# Patient Record
Sex: Male | Born: 1950 | Race: Asian | Hispanic: No | Marital: Married | Smoking: Never smoker
Health system: Southern US, Community
[De-identification: ages and names within clinical notes are randomized; demographics above are authoritative.]

## PROBLEM LIST (undated history)

## (undated) DIAGNOSIS — Z941 Heart transplant status: Secondary | ICD-10-CM

## (undated) DIAGNOSIS — I10 Essential (primary) hypertension: Secondary | ICD-10-CM

## (undated) DIAGNOSIS — M1990 Unspecified osteoarthritis, unspecified site: Secondary | ICD-10-CM

## (undated) DIAGNOSIS — N289 Disorder of kidney and ureter, unspecified: Secondary | ICD-10-CM

## (undated) HISTORY — PX: EYE SURGERY: SHX253

## (undated) HISTORY — PX: AV FISTULA PLACEMENT: SHX1204

## (undated) HISTORY — PX: HEART TRANSPLANT: SHX268

---

## 2007-02-25 DIAGNOSIS — Z941 Heart transplant status: Secondary | ICD-10-CM

## 2007-02-25 HISTORY — DX: Heart transplant status: Z94.1

## 2008-02-03 ENCOUNTER — Inpatient Hospital Stay: Payer: Self-pay | Admitting: Cardiovascular Disease

## 2008-07-27 ENCOUNTER — Ambulatory Visit: Payer: Self-pay | Admitting: Internal Medicine

## 2008-08-07 ENCOUNTER — Ambulatory Visit: Payer: Self-pay | Admitting: Internal Medicine

## 2008-11-01 ENCOUNTER — Encounter: Payer: Self-pay | Admitting: Internal Medicine

## 2008-11-24 ENCOUNTER — Encounter: Payer: Self-pay | Admitting: Internal Medicine

## 2016-02-08 ENCOUNTER — Emergency Department: Payer: Medicare Other

## 2016-02-08 ENCOUNTER — Encounter: Payer: Self-pay | Admitting: Emergency Medicine

## 2016-02-08 ENCOUNTER — Inpatient Hospital Stay
Admission: EM | Admit: 2016-02-08 | Discharge: 2016-02-14 | DRG: 682 | Disposition: A | Discharge status: Home Health | Payer: Medicare Other | Attending: Specialist | Admitting: Specialist

## 2016-02-08 ENCOUNTER — Encounter
Admission: EM | Disposition: A | Discharge status: Home Health | Payer: Self-pay | Source: Home / Self Care | Attending: Specialist

## 2016-02-08 DIAGNOSIS — I2729 Other secondary pulmonary hypertension: Secondary | ICD-10-CM | POA: Diagnosis present

## 2016-02-08 DIAGNOSIS — Z941 Heart transplant status: Secondary | ICD-10-CM

## 2016-02-08 DIAGNOSIS — D631 Anemia in chronic kidney disease: Secondary | ICD-10-CM | POA: Diagnosis present

## 2016-02-08 DIAGNOSIS — N186 End stage renal disease: Secondary | ICD-10-CM | POA: Diagnosis present

## 2016-02-08 DIAGNOSIS — N189 Chronic kidney disease, unspecified: Secondary | ICD-10-CM | POA: Diagnosis not present

## 2016-02-08 DIAGNOSIS — I5033 Acute on chronic diastolic (congestive) heart failure: Secondary | ICD-10-CM | POA: Diagnosis present

## 2016-02-08 DIAGNOSIS — R0602 Shortness of breath: Secondary | ICD-10-CM

## 2016-02-08 DIAGNOSIS — I132 Hypertensive heart and chronic kidney disease with heart failure and with stage 5 chronic kidney disease, or end stage renal disease: Secondary | ICD-10-CM | POA: Diagnosis present

## 2016-02-08 DIAGNOSIS — E871 Hypo-osmolality and hyponatremia: Secondary | ICD-10-CM | POA: Diagnosis present

## 2016-02-08 DIAGNOSIS — R0902 Hypoxemia: Secondary | ICD-10-CM

## 2016-02-08 DIAGNOSIS — J9601 Acute respiratory failure with hypoxia: Secondary | ICD-10-CM | POA: Diagnosis present

## 2016-02-08 DIAGNOSIS — Z79899 Other long term (current) drug therapy: Secondary | ICD-10-CM

## 2016-02-08 DIAGNOSIS — Z992 Dependence on renal dialysis: Secondary | ICD-10-CM | POA: Diagnosis not present

## 2016-02-08 DIAGNOSIS — I509 Heart failure, unspecified: Secondary | ICD-10-CM | POA: Diagnosis not present

## 2016-02-08 DIAGNOSIS — J811 Chronic pulmonary edema: Secondary | ICD-10-CM

## 2016-02-08 DIAGNOSIS — I429 Cardiomyopathy, unspecified: Secondary | ICD-10-CM | POA: Diagnosis present

## 2016-02-08 DIAGNOSIS — N179 Acute kidney failure, unspecified: Secondary | ICD-10-CM | POA: Diagnosis present

## 2016-02-08 DIAGNOSIS — I10 Essential (primary) hypertension: Secondary | ICD-10-CM

## 2016-02-08 DIAGNOSIS — E785 Hyperlipidemia, unspecified: Secondary | ICD-10-CM | POA: Diagnosis present

## 2016-02-08 DIAGNOSIS — E1122 Type 2 diabetes mellitus with diabetic chronic kidney disease: Secondary | ICD-10-CM | POA: Diagnosis present

## 2016-02-08 DIAGNOSIS — N170 Acute kidney failure with tubular necrosis: Secondary | ICD-10-CM | POA: Diagnosis not present

## 2016-02-08 DIAGNOSIS — E8770 Fluid overload, unspecified: Secondary | ICD-10-CM | POA: Diagnosis present

## 2016-02-08 DIAGNOSIS — J810 Acute pulmonary edema: Secondary | ICD-10-CM | POA: Diagnosis not present

## 2016-02-08 DIAGNOSIS — R609 Edema, unspecified: Secondary | ICD-10-CM

## 2016-02-08 HISTORY — DX: Disorder of kidney and ureter, unspecified: N28.9

## 2016-02-08 HISTORY — PX: PERIPHERAL VASCULAR CATHETERIZATION: SHX172C

## 2016-02-08 HISTORY — DX: Heart transplant status: Z94.1

## 2016-02-08 LAB — BASIC METABOLIC PANEL
ANION GAP: 15 (ref 5–15)
BUN: 88 mg/dL — ABNORMAL HIGH (ref 6–20)
CALCIUM: 7.4 mg/dL — AB (ref 8.9–10.3)
CO2: 18 mmol/L — ABNORMAL LOW (ref 22–32)
Chloride: 86 mmol/L — ABNORMAL LOW (ref 101–111)
Creatinine, Ser: 9.12 mg/dL — ABNORMAL HIGH (ref 0.61–1.24)
GFR, EST AFRICAN AMERICAN: 6 mL/min — AB (ref >60)
GFR, EST NON AFRICAN AMERICAN: 5 mL/min — AB (ref >60)
Glucose, Bld: 124 mg/dL — ABNORMAL HIGH (ref 65–99)
Potassium: 4 mmol/L (ref 3.5–5.1)
Sodium: 119 mmol/L — CL (ref 135–145)

## 2016-02-08 LAB — CBC WITH DIFFERENTIAL/PLATELET
BASOS ABS: 0.1 10*3/uL (ref 0–0.1)
BASOS PCT: 1 %
EOS PCT: 1 %
Eosinophils Absolute: 0.1 10*3/uL (ref 0–0.7)
HEMATOCRIT: 26.7 % — AB (ref 40.0–52.0)
Hemoglobin: 8.9 g/dL — ABNORMAL LOW (ref 13.0–18.0)
LYMPHS PCT: 5 %
Lymphs Abs: 0.6 10*3/uL — ABNORMAL LOW (ref 1.0–3.6)
MCH: 27.9 pg (ref 26.0–34.0)
MCHC: 33.4 g/dL (ref 32.0–36.0)
MCV: 83.7 fL (ref 80.0–100.0)
MONO ABS: 1.2 10*3/uL — AB (ref 0.2–1.0)
Monocytes Relative: 9 %
NEUTROS ABS: 11.3 10*3/uL — AB (ref 1.4–6.5)
Neutrophils Relative %: 84 %
PLATELETS: 303 10*3/uL (ref 150–440)
RBC: 3.19 MIL/uL — AB (ref 4.40–5.90)
RDW: 13.7 % (ref 11.5–14.5)
WBC: 13.4 10*3/uL — AB (ref 3.8–10.6)

## 2016-02-08 LAB — IRON AND TIBC
IRON: 14 ug/dL — AB (ref 45–182)
SATURATION RATIOS: 7 % — AB (ref 17.9–39.5)
TIBC: 190 ug/dL — AB (ref 250–450)
UIBC: 176 ug/dL

## 2016-02-08 LAB — PHOSPHORUS: Phosphorus: 6.8 mg/dL — ABNORMAL HIGH (ref 2.5–4.6)

## 2016-02-08 LAB — FERRITIN: Ferritin: 686 ng/mL — ABNORMAL HIGH (ref 24–336)

## 2016-02-08 LAB — BRAIN NATRIURETIC PEPTIDE: B Natriuretic Peptide: 1139 pg/mL — ABNORMAL HIGH (ref 0.0–100.0)

## 2016-02-08 LAB — TROPONIN I

## 2016-02-08 SURGERY — DIALYSIS/PERMA CATHETER INSERTION
Anesthesia: Moderate Sedation

## 2016-02-08 MED ORDER — LIDOCAINE-EPINEPHRINE 1 %-1:100000 IJ SOLN
INTRAMUSCULAR | Status: AC
  Filled 2016-02-08: qty 1

## 2016-02-08 MED ORDER — TACROLIMUS 0.5 MG PO CAPS
0.5000 mg | ORAL_CAPSULE | Freq: Two times a day (BID) | ORAL | Status: DC
  Administered 2016-02-08 – 2016-02-14 (×12): 0.5 mg via ORAL
  Filled 2016-02-08 (×14): qty 1

## 2016-02-08 MED ORDER — ACETAMINOPHEN 325 MG PO TABS
650.0000 mg | ORAL_TABLET | Freq: Four times a day (QID) | ORAL | Status: DC | PRN
  Administered 2016-02-12: 650 mg via ORAL
  Filled 2016-02-08: qty 2

## 2016-02-08 MED ORDER — FENTANYL CITRATE (PF) 100 MCG/2ML IJ SOLN
INTRAMUSCULAR | Status: AC
  Filled 2016-02-08: qty 2

## 2016-02-08 MED ORDER — ANTICOAGULANT SODIUM CITRATE 4% (200MG/5ML) IV SOLN
5.0000 mL | Freq: Every day | Status: DC
  Administered 2016-02-10 – 2016-02-14 (×4): 5 mL via INTRAVENOUS
  Filled 2016-02-08 (×12): qty 250

## 2016-02-08 MED ORDER — FERROUS SULFATE 325 (65 FE) MG PO TABS
325.0000 mg | ORAL_TABLET | Freq: Two times a day (BID) | ORAL | Status: DC
  Administered 2016-02-08 – 2016-02-14 (×12): 325 mg via ORAL
  Filled 2016-02-08 (×12): qty 1

## 2016-02-08 MED ORDER — HYDROCODONE-ACETAMINOPHEN 5-325 MG PO TABS
1.0000 | ORAL_TABLET | Freq: Four times a day (QID) | ORAL | Status: DC | PRN
  Administered 2016-02-08 – 2016-02-10 (×4): 1 via ORAL
  Filled 2016-02-08 (×4): qty 1

## 2016-02-08 MED ORDER — PANTOPRAZOLE SODIUM 40 MG PO TBEC
40.0000 mg | DELAYED_RELEASE_TABLET | Freq: Every day | ORAL | Status: DC
  Administered 2016-02-09 – 2016-02-14 (×6): 40 mg via ORAL
  Filled 2016-02-08 (×6): qty 1

## 2016-02-08 MED ORDER — CEFAZOLIN IN D5W 1 GM/50ML IV SOLN
1.0000 g | Freq: Once | INTRAVENOUS | Status: AC
  Administered 2016-02-08: 1 g via INTRAVENOUS
  Filled 2016-02-08 (×2): qty 50

## 2016-02-08 MED ORDER — HEPARIN SODIUM (PORCINE) 10000 UNIT/ML IJ SOLN
INTRAMUSCULAR | Status: AC
  Filled 2016-02-08: qty 1

## 2016-02-08 MED ORDER — ALTEPLASE 2 MG IJ SOLR
INTRAMUSCULAR | Status: AC
  Filled 2016-02-08: qty 2

## 2016-02-08 MED ORDER — CEFAZOLIN (ANCEF) 1 G IV SOLR: 1.0000 g | INTRAVENOUS | Status: DC

## 2016-02-08 MED ORDER — FENTANYL CITRATE (PF) 100 MCG/2ML IJ SOLN
INTRAMUSCULAR | Status: DC | PRN
  Administered 2016-02-08: 50 ug via INTRAVENOUS
  Administered 2016-02-08: 50 ug

## 2016-02-08 MED ORDER — MYCOPHENOLATE MOFETIL 250 MG PO CAPS
500.0000 mg | ORAL_CAPSULE | Freq: Two times a day (BID) | ORAL | Status: DC
  Administered 2016-02-08 – 2016-02-14 (×12): 500 mg via ORAL
  Filled 2016-02-08 (×13): qty 2

## 2016-02-08 MED ORDER — MIDAZOLAM HCL 2 MG/2ML IJ SOLN
INTRAMUSCULAR | Status: AC
  Filled 2016-02-08: qty 2

## 2016-02-08 MED ORDER — HEPARIN (PORCINE) IN NACL 2-0.9 UNIT/ML-% IJ SOLN
INTRAMUSCULAR | Status: AC
  Filled 2016-02-08: qty 500

## 2016-02-08 MED ORDER — PRAVASTATIN SODIUM 20 MG PO TABS
40.0000 mg | ORAL_TABLET | Freq: Every day | ORAL | Status: DC
  Administered 2016-02-09 – 2016-02-13 (×5): 40 mg via ORAL
  Filled 2016-02-08 (×5): qty 2

## 2016-02-08 MED ORDER — CARVEDILOL 25 MG PO TABS
25.0000 mg | ORAL_TABLET | Freq: Two times a day (BID) | ORAL | Status: DC
  Administered 2016-02-08 – 2016-02-14 (×12): 25 mg via ORAL
  Filled 2016-02-08 (×12): qty 1

## 2016-02-08 MED ORDER — AMLODIPINE BESYLATE 10 MG PO TABS
10.0000 mg | ORAL_TABLET | Freq: Every day | ORAL | Status: DC
  Administered 2016-02-09: 10 mg via ORAL
  Filled 2016-02-08: qty 1

## 2016-02-08 MED ORDER — SODIUM CHLORIDE 0.9% FLUSH
3.0000 mL | Freq: Two times a day (BID) | INTRAVENOUS | Status: DC
  Administered 2016-02-08 – 2016-02-13 (×9): 3 mL via INTRAVENOUS

## 2016-02-08 MED ORDER — MIDAZOLAM HCL 2 MG/2ML IJ SOLN
INTRAMUSCULAR | Status: DC | PRN
  Administered 2016-02-08 (×2): 1 mg via INTRAVENOUS

## 2016-02-08 MED ORDER — TUBERCULIN PPD 5 UNIT/0.1ML ID SOLN
5.0000 [IU] | Freq: Once | INTRADERMAL | Status: AC
  Administered 2016-02-08: 5 [IU] via INTRADERMAL
  Filled 2016-02-08 (×2): qty 0.1

## 2016-02-08 SURGICAL SUPPLY — 5 items
CATH PALINDROME RT-P 15FX19CM (CATHETERS) ×2 IMPLANT
NEEDLE ENTRY 21GA 7CM ECHOTIP (NEEDLE) ×2 IMPLANT
PACK ANGIOGRAPHY (CUSTOM PROCEDURE TRAY) ×2 IMPLANT
SET INTRO CAPELLA COAXIAL (SET/KITS/TRAYS/PACK) ×2 IMPLANT
TOWEL OR 17X26 4PK STRL BLUE (TOWEL DISPOSABLE) ×2 IMPLANT

## 2016-02-08 NOTE — Progress Notes (Signed)
Pre dialysis assessment 

## 2016-02-08 NOTE — Consult Note (Signed)
[facility] Consult Note  MRN : [id number]  Ralph Allison is a 65 y.o. (1950/05/10) male who presents with chief complaint of  Chief Complaint  Patient presents with  . Shortness of Breath  . Leg Swelling  .  History of Present Illness: I am asked to evaluate Ralph Allison by Dr Candiss Norse.  The patient is a 66 y.o. male with a known history of Diabetes, heart transplant 2008, chronic renal failure- follows at [facility] cardiology for his transplant related issues. Recently started having worsening in his kidney function and pulmonary edema and was admitted last week at [facility] where he was discharged home and given appointment with nephrology clinic and vascular clinic for possible AV fistula placements. He continued to feel short of breath and gaining weight so he went to his primary care physician yesterday where he suggested that patient would need to have 1 or 2 sessions of dialysis to take out this fluid and he cannot wait until those appointments in have for next month. Concerned with this they decided to come to emergency room today.  Current Facility-Administered Medications  Medication Dose Route Frequency Provider Last Rate Last Dose  . amLODipine (NORVASC) tablet 10 mg  10 mg Oral Daily Vaughan Basta, MD      . carvedilol (COREG) tablet 25 mg  25 mg Oral Q12H Vaughan Basta, MD      . ferrous sulfate tablet 325 mg  325 mg Oral BID Vaughan Basta, MD      . mycophenolate (CELLCEPT) capsule 500 mg  500 mg Oral BID Vaughan Basta, MD      . pantoprazole (PROTONIX) EC tablet 40 mg  40 mg Oral Daily Vaughan Basta, MD      . Derrill Memo ON 02/09/2016] pravastatin (PRAVACHOL) tablet 40 mg  40 mg Oral QHS Vaughan Basta, MD      . sodium chloride flush (NS) 0.9 % injection 3 mL  3 mL Intravenous Q12H Vaughan Basta, MD      . tacrolimus (PROGRAF) capsule 0.5 mg  0.5 mg Oral BID Vaughan Basta, MD        Past  Medical History:  Diagnosis Date  . Diabetes mellitus without complication ([place])   . Heart transplant recipient [facility]) 2009  . Renal disorder     Past Surgical History:  Procedure Laterality Date  . HEART TRANSPLANT      Social History Social History  Substance Use Topics  . Smoking status: Never Smoker  . Smokeless tobacco: Never Used  . Alcohol use No    Family History Family History  Problem Relation Age of Onset  . Hypertension Father   No family history of bleeding/clotting disorders, porphyria or autoimmune disease   Allergies  Allergen Reactions  . Heparin     Heart transplant     REVIEW OF SYSTEMS (Negative unless checked)  Constitutional: $RemoveBeforeDE'[]'JBjYCJAJqfTcISv$ Weight loss  $Rem'[]'WBta$ Fever  $Remo'[]'rMeLX$ Chills Cardiac: $RemoveBeforeD'[]'cdPQDaicqKVteK$ Chest pain   '[]'$ Chest pressure   '[]'$ Palpitations   '[x]'$ Shortness of breath when laying flat   '[]'$ Shortness of breath at rest   '[x]'$ Shortness of breath with exertion. Vascular:  $RemoveBe'[]'LqclGsjZX$ Pain in legs with walking   '[]'$ Pain in legs at rest   '[]'$ Pain in legs when laying flat   '[]'$ Claudication   '[]'$ Pain in feet when walking  $Remove'[]'HmujfVL$ Pain in feet at rest  $Rem'[]'bnMp$ Pain in feet when laying flat   '[]'$ History of DVT   '[]'$ Phlebitis   '[x]'$ Swelling in legs   '[]'$ Varicose veins   '[]'$ Non-healing ulcers Pulmonary:   '[]'$   Uses home oxygen   [] Productive cough   [] Hemoptysis   [] Wheeze  [] COPD   [] Asthma Neurologic:  [] Dizziness  [] Blackouts   [] Seizures   [] History of stroke   [] History of TIA  [] Aphasia   [] Temporary blindness   [] Dysphagia   [] Weakness or numbness in arms   [] Weakness or numbness in legs Musculoskeletal:  [] Arthritis   [] Joint swelling   [] Joint pain   [] Low back pain Hematologic:  [] Easy bruising  [] Easy bleeding   [] Hypercoagulable state   [] Anemic  [] Hepatitis Gastrointestinal:  [] Blood in stool   [] Vomiting blood  [] Gastroesophageal reflux/heartburn   [] Difficulty swallowing. Genitourinary:  [x] Chronic kidney disease   [] Difficult urination  [] Frequent urination  [] Burning with urination   [] Blood in urine Skin:   [] Rashes   [] Ulcers   [] Wounds Psychological:  [] History of anxiety   []  History of major depression.  Physical Examination  Vitals:   02/08/16 1430 02/08/16 1500 02/08/16 1530 02/08/16 1603  BP: 126/78 130/78 122/79 118/66  Pulse: 75 75 78 79  Resp: 18 (!) 21 (!) 22 19  Temp:    97.4 F (36.3 C)  TempSrc:    Oral  SpO2: 99% 95% 94% 97%  Weight:    54.9 kg (121 lb 1.6 oz)  Height:    5\' 2"  (1.575 m)   Body mass index is 22.15 kg/m. Gen:  WD/WN, NAD Head: [place]/AT, No temporalis wasting. Prominent temp pulse not noted. Ear/Nose/Throat: Hearing grossly intact, nares w/o erythema or drainage, oropharynx w/o Erythema/Exudate Eyes: Sclera non-icteric, conjunctiva clear Neck: Trachea midline.  No JVD.  Pulmonary:  Good air movement, respirations not labored, equal bilaterally.  Cardiac: RRR, normal S1, S2. Vascular:  Vessel Right Left  Radial Palpable Palpable  Ulnar Palpable Palpable  Brachial Palpable Palpable  Carotid Palpable, without bruit Palpable, without bruit  Aorta Not palpable N/A  Femoral Palpable Palpable  Gastrointestinal: soft, non-tender/non-distended. No guarding/reflex.  Musculoskeletal: M/S 5/5 throughout.  Extremities without ischemic changes.  No deformity or atrophy. No edema. Neurologic: Sensation grossly intact in extremities.  Symmetrical.  Speech is fluent. Motor exam as listed above. Psychiatric: Judgment intact, Mood & affect appropriate for pt's clinical situation. Dermatologic: No rashes or ulcers noted.  No cellulitis or open wounds. Lymph : No Cervical, Axillary, or Inguinal lymphadenopathy.    CBC Lab Results  Component Value Date   WBC 13.4 (H) 02/08/2016   HGB 8.9 (L) 02/08/2016   HCT 26.7 (L) 02/08/2016   MCV 83.7 02/08/2016   PLT 303 02/08/2016    BMET    Component Value Date/Time   NA 119 (LL) 02/08/2016 1337   K 4.0 02/08/2016 1337   CL 86 (L) 02/08/2016 1337   CO2 18 (L) 02/08/2016 1337   GLUCOSE 124 (H) 02/08/2016 1337   BUN  88 (H) 02/08/2016 1337   CREATININE 9.12 (H) 02/08/2016 1337   CALCIUM 7.4 (L) 02/08/2016 1337   GFRNONAA 5 (L) 02/08/2016 1337   GFRAA 6 (L) 02/08/2016 1337   Estimated Creatinine Clearance: 6.2 mL/min (by C-G formula based on SCr of 9.12 mg/dL (H)).  COAG No results found for: INR, PROTIME  Radiology Dg Chest Port 1 View  Result Date: 02/08/2016 CLINICAL DATA:  Acute renal failure.  Missed dialysis. EXAM: PORTABLE CHEST 1 VIEW COMPARISON:  02/03/2008 FINDINGS: Surgical changes from bypass surgery are noted. The heart is enlarged. There is tortuosity and ectasia of the thoracic aorta. There are bilateral pleural effusions, right greater than left. Perihilar/fat plane airspace process, asymmetric on  the right. This is likely asymmetric pulmonary edema. No significant bony findings. IMPRESSION: Cardiac enlargement, bilateral pleural effusions and probable asymmetric pattern of perihilar pulmonary edema. Electronically Signed   By: Marijo Sanes M.D.   On: 02/08/2016 14:05      Assessment/Plan 1.  Acute on chronic renal failure   Pulmonary edema   Acute hypoxic respiratory failure    Patient will need to be initiated on hemodialysis.   We will urgently place a permacath    Continue supplemental oxygen.  2.  Hypertension   Continue home medication, currently stable.   Hold Lasix.  3.  History of heart transplant   Continue his transplant medication as he was taking at home.   He follows at [facility] for this.  4.  Hyperlipidemia    continue statin.  5.  Chronic anemia   Due to chronic renal failure. Continue monitoring.  Hortencia Pilar, MD  02/08/2016 5:26 PM    This note was created with Dragon medical transcription system.  Any error is purely unintentional

## 2016-02-08 NOTE — ED Notes (Addendum)
Per wife, she thinks he needs to get dialysis; currently is not a dialysis pt. SHOB worse with exertion. Started a day or 2 ago. sats were 86% room air.

## 2016-02-08 NOTE — Progress Notes (Signed)
HD COMPLETED  

## 2016-02-08 NOTE — ED Notes (Signed)
EKG was completed and exported into the system.

## 2016-02-08 NOTE — Progress Notes (Signed)
Patient given hot dinner tray. Family at bedside. Patient to go to dialysis from the unit. Family is aware. Report called to RN Glennon Mac on 2A.

## 2016-02-08 NOTE — Progress Notes (Signed)
Patient returned from dialysis with no issues. C/o pain on right chest due to parmacath placement. Hydrocodone  1 tab PRN administered and will re-assess later. PPD of 5 units administered on right forearm with a lot # [id number] and an expiration date of 01/08/17. Patient tolerated well. Will be read in 2-3 days. Will continue to monitor.

## 2016-02-08 NOTE — ED Notes (Signed)
Dr Jimmye Norman notified [phone]

## 2016-02-08 NOTE — Consult Note (Signed)
Date: 02/08/2016                  Patient Name:  Ralph Allison  MRN: [id number]  DOB: 1950-08-07  Age / Sex: 65 y.o., male         PCP: Diamond Nickel, MD                 Service Requesting Consult: Internal medicine                  Reason for Consult: Chronic kidney disease stage V             History of Present Illness: Patient is a 65 y.o. male with medical problems of Chronic kidney disease stage V followed at [facility], heart transplant at [facility] in February 2010, hypertension, HIT  who was admitted to [facility] on 02/08/2016 for evaluation of shortness of breath and lower extremity edema.  Patient is in the process of getting permanent access at [facility]. He is scheduled for surgery for his access next month. Over the last several days, patient progressively got short of breath. He went to see his primary care doctor who advised him to get admitted to the hospital to start dialysis. Patient does not have any access at present. Patient has poor appetite. His legs are swollen. Cause for renal failure is secondary to hypertension and chronic CNI exposure He takes Prograf and mycophenolate for immunosuppression for heart transplant     Medications: Outpatient medications: Prescriptions Prior to Admission  Medication Sig Dispense Refill Last Dose  . amLODipine (NORVASC) 10 MG tablet Take 1 tablet by mouth daily.   02/08/2016 at 0800  . aspirin EC 81 MG tablet Take 1 tablet by mouth every 3 (three) days.   Past Week at Unknown time  . b complex vitamins tablet Take 1 tablet by mouth daily.   02/08/2016 at 0800  . carvedilol (COREG) 25 MG tablet Take 1 tablet by mouth every 12 (twelve) hours.   02/08/2016 at 0800  . ferrous sulfate 325 (65 FE) MG EC tablet Take 1 tablet by mouth 2 (two) times daily.     . furosemide (LASIX) 40 MG tablet Take 1 tablet by mouth 2 (two) times daily.   02/08/2016 at 0800  . mycophenolate (CELLCEPT) 500 MG tablet Take 1 tablet by mouth 2 (two) times daily.    02/08/2016 at 0800  . pantoprazole (PROTONIX) 40 MG tablet Take 1 tablet by mouth daily.   02/08/2016 at 0800  . pravastatin (PRAVACHOL) 40 MG tablet Take 1 tablet by mouth at bedtime.   02/08/2016 at 0800  . sodium bicarbonate 650 MG tablet Take 650 mg by mouth 2 (two) times daily.   02/08/2016 at 0800  . tacrolimus (PROGRAF) 0.5 MG capsule Take 0.5 mg by mouth 2 (two) times daily.   02/08/2016 at 0800    Current medications: Current Facility-Administered Medications  Medication Dose Route Frequency Provider Last Rate Last Dose  . [MAR Hold] amLODipine (NORVASC) tablet 10 mg  10 mg Oral Daily Vaughan Basta, MD      . Doug Sou Hold] carvedilol (COREG) tablet 25 mg  25 mg Oral Q12H Vaughan Basta, MD      . Doug Sou Hold] ceFAZolin (ANCEF) powder 1 g  1 g Other To OR Katha Cabal, MD      . Doug Sou Hold] ferrous sulfate tablet 325 mg  325 mg Oral BID Vaughan Basta, MD      . Doug Sou Hold] mycophenolate (  CELLCEPT) capsule 500 mg  500 mg Oral BID Vaughan Basta, MD      . Doug Sou Hold] pantoprazole (PROTONIX) EC tablet 40 mg  40 mg Oral Daily Vaughan Basta, MD      . Doug Sou Hold] pravastatin (PRAVACHOL) tablet 40 mg  40 mg Oral QHS Vaughan Basta, MD      . Doug Sou Hold] sodium chloride flush (NS) 0.9 % injection 3 mL  3 mL Intravenous Q12H Vaughan Basta, MD      . Doug Sou Hold] tacrolimus (PROGRAF) capsule 0.5 mg  0.5 mg Oral BID Vaughan Basta, MD          Allergies: Allergies  Allergen Reactions  . Heparin     Heart transplant      Past Medical History: Past Medical History:  Diagnosis Date  . Diabetes mellitus without complication ([place])   . Heart transplant recipient [facility]) 2009  . Renal disorder      Past Surgical History: Past Surgical History:  Procedure Laterality Date  . HEART TRANSPLANT       Family History: Family History  Problem Relation Age of Onset  . Hypertension Father      Social History: Social History    Social History  . Marital status: Married    Spouse name: N/A  . Number of children: N/A  . Years of education: N/A   Occupational History  . Not on file.   Social History Main Topics  . Smoking status: Never Smoker  . Smokeless tobacco: Never Used  . Alcohol use No  . Drug use: Unknown  . Sexual activity: Not on file   Other Topics Concern  . Not on file   Social History Narrative  . No narrative on file     Review of Systems: Gen: No fevers or chills  HEENT: Denies any vision or hearing problems  CV: History of heart transplant, takes immunosuppressants chronically  Resp: Shortness of breath for the past several days  LZ:JQBHALPFX appetite, unintentional weight loss  GU : Denies any problems with voiding, no blood in the urine  MS: No acute joint pains  Derm:  No complaints  Psych:No complaints  Heme: No complaints  Neuro: No complaints  Endocrine. No complaints  Vital Signs: Blood pressure 122/75, pulse 84, temperature 97.8 F (36.6 C), temperature source Oral, resp. rate 18, height $RemoveBe'5\' 2"'SKromnRzs$  (1.575 m), weight 54.9 kg (121 lb), SpO2 92 %.   Intake/Output Summary (Last 24 hours) at 02/08/16 1757 Last data filed at 02/08/16 1728  Gross per 24 hour  Intake                0 ml  Output                0 ml  Net                0 ml    Weight trends: Filed Weights   02/08/16 1333 02/08/16 1603 02/08/16 1750  Weight: 56.2 kg (124 lb) 54.9 kg (121 lb 1.6 oz) 54.9 kg (121 lb)    Physical Exam: General:  No acute distress  HEENT Anicteric, moist mucus membranes  Neck:  supple  Lungs: Coarse crackles b/l  Heart::  no rub or gallop  Abdomen: Soft, NT  Extremities:  ++ edema  Neurologic: Alert, oreinted  Skin: No acute rashes  Access: To be placed          Lab results: Basic Metabolic Panel:  Recent Labs Lab 02/08/16 1337  NA  119*  K 4.0  CL 86*  CO2 18*  GLUCOSE 124*  BUN 88*  CREATININE 9.12*  CALCIUM 7.4*    Liver Function Tests: No  results for input(s): AST, ALT, ALKPHOS, BILITOT, PROT, ALBUMIN in the last 168 hours. No results for input(s): LIPASE, AMYLASE in the last 168 hours. No results for input(s): AMMONIA in the last 168 hours.  CBC:  Recent Labs Lab 02/08/16 1337  WBC 13.4*  NEUTROABS 11.3*  HGB 8.9*  HCT 26.7*  MCV 83.7  PLT 303    Cardiac Enzymes:  Recent Labs Lab 02/08/16 1337  TROPONINI <0.03    BNP: Invalid input(s): POCBNP  CBG: No results for input(s): GLUCAP in the last 168 hours.  Microbiology: No results found for this or any previous visit (from the past 720 hour(s)).   Coagulation Studies: No results for input(s): LABPROT, INR in the last 72 hours.  Urinalysis: No results for input(s): COLORURINE, LABSPEC, PHURINE, GLUCOSEU, HGBUR, BILIRUBINUR, KETONESUR, PROTEINUR, UROBILINOGEN, NITRITE, LEUKOCYTESUR in the last 72 hours.  Invalid input(s): APPERANCEUR      Imaging: Dg Chest Port 1 View  Result Date: 02/08/2016 CLINICAL DATA:  Acute renal failure.  Missed dialysis. EXAM: PORTABLE CHEST 1 VIEW COMPARISON:  02/03/2008 FINDINGS: Surgical changes from bypass surgery are noted. The heart is enlarged. There is tortuosity and ectasia of the thoracic aorta. There are bilateral pleural effusions, right greater than left. Perihilar/fat plane airspace process, asymmetric on the right. This is likely asymmetric pulmonary edema. No significant bony findings. IMPRESSION: Cardiac enlargement, bilateral pleural effusions and probable asymmetric pattern of perihilar pulmonary edema. Electronically Signed   By: Marijo Sanes M.D.   On: 02/08/2016 14:05      Assessment & Plan: Pt is a 65 y.o. yo male with  medical problems of Chronic kidney disease stage V followed at [facility], heart transplant at [facility] in February 2010, hypertension, HIT  who was admitted to [facility] on 02/08/2016 for evaluation of shortness of breath and lower extremity edema.  1. End-stage renal disease 2. Severe  hyponatremia 3. Volume overload 4. Pulmonary edema 5. Anemia  Plan: Patient presents with uremic symptoms and volume overload. He needs to start dialysis urgently today. Dr. Delana Meyer has graciously agreed to place a PermCath. Once access is available, we will start him on dialysis. Today's treatment will be 1.5 hours with low blood flow rate. we will increase time and BFR slowly Discharge planning. Place PPD and order hepatitis studies Case discussed with patient's wife and Dr Marthann Schiller

## 2016-02-08 NOTE — ED Provider Notes (Signed)
[facility] Emergency Department Provider Note        Time seen: ----------------------------------------- 1:32 PM on 02/08/2016 -----------------------------------------    I have reviewed the triage vital signs and the nursing notes.   HISTORY  Chief Complaint Shortness of Breath and Leg Swelling    HPI Ralph Allison is a 65 y.o. male who presents to the ER for shortness of breath. Patient reports shortness of breath and leg swelling over the last 3 weeks and notes he is gaining fluid. Patient discussed dialysis with a [facility] nephrologist is one week ago. He has had an ultrasound of his left arm for possible AV fistula placement. Patient denies recent illness, just complains of shortness of breath and swelling.   No past medical history on file.  There are no active problems to display for this patient.   No past surgical history on file.  Allergies Patient has no allergy information on record.  Social History Social History  Substance Use Topics  . Smoking status: Not on file  . Smokeless tobacco: Not on file  . Alcohol use Not on file    Review of Systems Constitutional: Negative for fever. Cardiovascular: Negative for chest pain. Respiratory: Positive for shortness of breath Gastrointestinal: Negative for abdominal pain, vomiting and diarrhea. Genitourinary: Negative for dysuria. Musculoskeletal: Negative for back pain. Positive for edema Skin: Negative for rash. Neurological: Negative for headaches, focal weakness or numbness.  10-point ROS otherwise negative.  ____________________________________________   PHYSICAL EXAM:  VITAL SIGNS: ED Triage Vitals  Enc Vitals Group     BP      Pulse      Resp      Temp      Temp src      SpO2      Weight      Height      Head Circumference      Peak Flow      Pain Score      Pain Loc      Pain Edu?      Excl. in [place]?     Constitutional: Alert and oriented. Well appearing  and in no distress. Eyes: Conjunctivae are normal. PERRL. Normal extraocular movements. ENT   Head: Normocephalic and atraumatic.   Nose: No congestion/rhinnorhea.   Mouth/Throat: Mucous membranes are moist.   Neck: No stridor. Cardiovascular: Normal rate, regular rhythm. No murmurs, rubs, or gallops. Respiratory: Normal respiratory effort without tachypnea nor retractions.Rales bilaterally Gastrointestinal: Soft and nontender. Normal bowel sounds Musculoskeletal: Nontender with normal range of motion in all extremities. Pitting edema in the lower extremities Neurologic:  Normal speech and language. No gross focal neurologic deficits are appreciated.  Skin:  Skin is warm, dry and intact. No rash noted. Psychiatric: Mood and affect are normal. Speech and behavior are normal.  ____________________________________________  EKG: Interpreted by me. Sinus rhythm there is 74 bpm, normal PR interval, wide QRS, normal QT, normal axis. Right bundle branch block. ____________________________________________  ED COURSE:  Pertinent labs & imaging results that were available during my care of the patient were reviewed by me and considered in my medical decision making (see chart for details). Clinical Course   Patient presents to the ER for shortness of breath and likely renal failure. We will check basic labs and discussed with nephrology.  Procedures ____________________________________________   LABS (pertinent positives/negatives)  Labs Reviewed  CBC WITH DIFFERENTIAL/PLATELET - Abnormal; Notable for the following:       Result Value   WBC  13.4 (*)    RBC 3.19 (*)    Hemoglobin 8.9 (*)    HCT 26.7 (*)    Neutro Abs 11.3 (*)    Lymphs Abs 0.6 (*)    Monocytes Absolute 1.2 (*)    All other components within normal limits  BASIC METABOLIC PANEL - Abnormal; Notable for the following:    Sodium 119 (*)    Chloride 86 (*)    CO2 18 (*)    Glucose, Bld 124 (*)    BUN 88 (*)     Creatinine, Ser 9.12 (*)    Calcium 7.4 (*)    GFR calc non Af Amer 5 (*)    GFR calc Af Amer 6 (*)    All other components within normal limits  BRAIN NATRIURETIC PEPTIDE - Abnormal; Notable for the following:    B Natriuretic Peptide 1,139.0 (*)    All other components within normal limits  TROPONIN I   CRITICAL CARE Performed by: Earleen Newport   Total critical care time: 30 minutes  Critical care time was exclusive of separately billable procedures and treating other patients.  Critical care was necessary to treat or prevent imminent or life-threatening deterioration.  Critical care was time spent personally by me on the following activities: development of treatment plan with patient and/or surrogate as well as nursing, discussions with consultants, evaluation of patient's response to treatment, examination of patient, obtaining history from patient or surrogate, ordering and performing treatments and interventions, ordering and review of laboratory studies, ordering and review of radiographic studies, pulse oximetry and re-evaluation of patient's condition.  RADIOLOGY Images were viewed by me  Chest x-ray IMPRESSION: Cardiac enlargement, bilateral pleural effusions and probable asymmetric pattern of perihilar pulmonary edema. ____________________________________________  FINAL ASSESSMENT AND PLAN  Dyspnea, hypoxia, renal failure, Hyponatremia  Plan: Patient with labs and imaging as dictated above. Patient presented to the ER with dyspnea, edema and likely worsening renal failure. Labs and imaging confirmed same. Have discussed with nephrology and with internal medicine who agrees to admit the patient. Currently he is medically stable for admission. He will need a temporary dialysis catheter and dialysis likely the next 24 hours. Family is agreeable to plan.   Earleen Newport, MD   Note: This dictation was prepared with Dragon dictation. Any  transcriptional errors that result from this process are unintentional    Earleen Newport, MD 02/08/16 1425

## 2016-02-08 NOTE — Progress Notes (Signed)
POST TREATMENT REPORT

## 2016-02-08 NOTE — Op Note (Signed)
OPERATIVE NOTE   PROCEDURE: 1. Insertion of tunneled dialysis catheter right IJ approach with ultrasound and fluoroscopic guidance.  PRE-OPERATIVE DIAGNOSIS: Acute on chronic renal insufficiency; pulmonary edema with hypoxia  POST-OPERATIVE DIAGNOSIS: Same  SURGEON: Hortencia Pilar.  ANESTHESIA: Conscious sedation was administered under my direct supervision. IV Versed plus fentanyl were utilized. Continuous ECG, pulse oximetry and blood pressure was monitored throughout the entire procedure. Conscious sedation was for a total of 25 minutes.  ESTIMATED BLOOD LOSS: Minimal cc  CONTRAST USED:  None  FLUOROSCOPY TIME:  0.5 minutes  INDICATIONS:   Ralph Allison a 65 y.o. y.o. male who presents with pulmonary edema.  He has known renal insufficiency which is now progressed and he will require urgent dialysis. He is undergoing tunneled catheter placement. The risks and benefits have been reviewed all questions answered the patient agrees to proceed with catheter placement  DESCRIPTION: After obtaining full informed written consent, the patient was positioned supine. The right neck and chest wall was prepped and draped in a sterile fashion. Ultrasound was placed in a sterile sleeve. Ultrasound was utilized to identify the right internal jugular vein which is noted to be echolucent and compressible indicating patency. Images recorded for the permanent record. Under real-time visualization a micro-needle is inserted into the vein followed by the micro-wire. Micro-sheath was then advanced without difficulty and a J wire is advanced without difficulty under fluoroscopic guidance. Small counterincision was made at the wire insertion site. Dilators are passed over the wire and the tunneled dialysis catheter is fed into the central venous system without difficulty.  Under fluoroscopy the catheter tip positioned at the atrial caval junction. The catheter is then approximated to the chest wall and an  exit site selected. 1% lidocaine is infiltrated in soft tissues at this level small incision is made and the tunneling device is then passed from the exit site to the neck counterincision. Catheter is then connected to the tunneling device and the catheter was pulled subcutaneously. It is then transected and the hub assembly connected without difficulty. Both lumens aspirate and flush easily. After verification of smooth contour with proper tip position under fluoroscopy the catheter is packed with 5000 units of heparin per lumen.  Catheter secured to the skin of the right chest wall with 0 silk. A sterile dressing is applied with a Biopatch.  COMPLICATIONS: None  CONDITION: Good  Hortencia Pilar [facility]. Office:  [phone]   02/08/2016,6:42 PM

## 2016-02-08 NOTE — Progress Notes (Signed)
Pre hd tx

## 2016-02-08 NOTE — H&P (Signed)
[facility] at [place] NAME: Ralph Allison    MR#:  [id number]  DATE OF BIRTH:  26-Aug-1950  DATE OF ADMISSION:  02/08/2016  PRIMARY CARE PHYSICIAN: Diamond Nickel, MD   REQUESTING/REFERRING PHYSICIAN: Williams  CHIEF COMPLAINT:   Chief Complaint  Patient presents with  . Shortness of Breath  . Leg Swelling    HISTORY OF PRESENT ILLNESS: Ralph Allison  is a 65 y.o. male with a known history of Diabetes, heart transplant 2008, chronic renal failure- follows at [facility] cardiology for his transplant related issues. Recently started having worsening in his kidney function and pulmonary edema and was admitted last week at [facility] where he was discharged home and given appointment with nephrology clinic and vascular clinic for possible AV fistula placements. He continued to feel short of breath and gaining weight so he went to his primary care physician yesterday where he suggested that patient would need to have 1 or 2 sessions of dialysis to take out this fluid and he cannot wait until those appointments in have for next month. Concerned with this they decided to come to emergency room today. ER physician spoke to them and gave him option to get transferred to [facility] emergency room as his all the care is fair, but they chose to stay here as they were not very sure if [facility] will initiate the dialysis today or would send him home to follow up his appointments with outpatient clinics. ER physician spoke to nephrologist on call and he agreed to initiate hemodialysis. Patient is hypoxic and requiring supplemental oxygen.  PAST MEDICAL HISTORY:   Past Medical History:  Diagnosis Date  . Diabetes mellitus without complication ([place])   . Heart transplant recipient [facility]) 2009  . Renal disorder     PAST SURGICAL HISTORY: Past Surgical History:  Procedure Laterality Date  . HEART TRANSPLANT      SOCIAL HISTORY:  Social History  Substance Use Topics  .  Smoking status: Never Smoker  . Smokeless tobacco: Never Used  . Alcohol use No    FAMILY HISTORY:  Family History  Problem Relation Age of Onset  . Hypertension Father     DRUG ALLERGIES:  Allergies  Allergen Reactions  . Heparin     Heart transplant    REVIEW OF SYSTEMS:   CONSTITUTIONAL: No fever,Positive fatigue or weakness.  EYES: No blurred or double vision.  EARS, NOSE, AND THROAT: No tinnitus or ear pain.  RESPIRATORY: No cough, positive shortness of breath, no wheezing or hemoptysis.  CARDIOVASCULAR: No chest pain, orthopnea, positive edema.  GASTROINTESTINAL: No nausea, vomiting, diarrhea or abdominal pain.  GENITOURINARY: No dysuria, hematuria.  ENDOCRINE: No polyuria, nocturia,  HEMATOLOGY: No anemia, easy bruising or bleeding SKIN: No rash or lesion. MUSCULOSKELETAL: No joint pain or arthritis.   NEUROLOGIC: No tingling, numbness, weakness.  PSYCHIATRY: No anxiety or depression.   MEDICATIONS AT HOME:  Prior to Admission medications   Medication Sig Start Date End Date Taking? Authorizing Provider  amLODipine (NORVASC) 10 MG tablet Take 1 tablet by mouth daily. 01/25/16 01/24/17 Yes Historical Provider, MD  carvedilol (COREG) 25 MG tablet Take 1 tablet by mouth every 12 (twelve) hours. 09/09/15 09/08/16 Yes Historical Provider, MD  ferrous sulfate 325 (65 FE) MG EC tablet Take 1 tablet by mouth 2 (two) times daily. 01/30/16 01/29/17 Yes Historical Provider, MD  furosemide (LASIX) 40 MG tablet Take 1 tablet by mouth 2 (two) times daily. 01/30/16 01/29/17 Yes Historical  Provider, MD  mycophenolate (CELLCEPT) 500 MG tablet Take 1 tablet by mouth 2 (two) times daily. 12/19/15  Yes Historical Provider, MD  pantoprazole (PROTONIX) 40 MG tablet Take 1 tablet by mouth daily. 04/04/15  Yes Historical Provider, MD  pravastatin (PRAVACHOL) 40 MG tablet Take 1 tablet by mouth at bedtime.    Historical Provider, MD  tacrolimus (PROGRAF) 1 MG capsule Take 1 capsule by mouth 3 (three)  times daily. 12/17/15   Historical Provider, MD      PHYSICAL EXAMINATION:   VITAL SIGNS: Blood pressure 130/78, pulse 75, temperature 98.4 F (36.9 C), temperature source Oral, resp. rate (!) 21, height $RemoveBe'5\' 2"'SHPytlwXL$  (1.575 m), weight 56.2 kg (124 lb), SpO2 95 %.  GENERAL:  65 y.o.-year-old patient lying in the bed with no acute distress.  EYES: Pupils equal, round, reactive to light and accommodation. No scleral icterus. Extraocular muscles intact.  HEENT: Head atraumatic, normocephalic. Oropharynx and nasopharynx clear.  NECK:  Supple, no jugular venous distention. No thyroid enlargement, no tenderness.  LUNGS: Normal breath sounds bilaterally, no wheezing, Bilateral crepitation. No use of accessory muscles of respiration.    Requiring oxygen via nasal cannula. CARDIOVASCULAR: S1, S2 normal. No murmurs, rubs, or gallops. Surgical scar present ABDOMEN: Soft, nontender, nondistended. Bowel sounds present. No organomegaly or mass.  EXTREMITIES: Bilateral pedal edema, no cyanosis, or clubbing.  NEUROLOGIC: Cranial nerves II through XII are intact. Muscle strength 5/5 in all extremities. Sensation intact. Gait not checked.  PSYCHIATRIC: The patient is alert and oriented x 3.  SKIN: No obvious rash, lesion, or ulcer.   LABORATORY PANEL:   CBC  Recent Labs Lab 02/08/16 1337  WBC 13.4*  HGB 8.9*  HCT 26.7*  PLT 303  MCV 83.7  MCH 27.9  MCHC 33.4  RDW 13.7  LYMPHSABS 0.6*  MONOABS 1.2*  EOSABS 0.1  BASOSABS 0.1   ------------------------------------------------------------------------------------------------------------------  Chemistries   Recent Labs Lab 02/08/16 1337  NA 119*  K 4.0  CL 86*  CO2 18*  GLUCOSE 124*  BUN 88*  CREATININE 9.12*  CALCIUM 7.4*   ------------------------------------------------------------------------------------------------------------------ estimated creatinine clearance is 6.2 mL/min (by C-G formula based on SCr of 9.12 mg/dL  (H)). ------------------------------------------------------------------------------------------------------------------ No results for input(s): TSH, T4TOTAL, T3FREE, THYROIDAB in the last 72 hours.  Invalid input(s): FREET3   Coagulation profile No results for input(s): INR, PROTIME in the last 168 hours. ------------------------------------------------------------------------------------------------------------------- No results for input(s): DDIMER in the last 72 hours. -------------------------------------------------------------------------------------------------------------------  Cardiac Enzymes  Recent Labs Lab 02/08/16 1337  TROPONINI <0.03   ------------------------------------------------------------------------------------------------------------------ Invalid input(s): POCBNP  ---------------------------------------------------------------------------------------------------------------  Urinalysis No results found for: COLORURINE, APPEARANCEUR, LABSPEC, PHURINE, GLUCOSEU, HGBUR, BILIRUBINUR, KETONESUR, PROTEINUR, UROBILINOGEN, NITRITE, LEUKOCYTESUR   RADIOLOGY: Dg Chest Port 1 View  Result Date: 02/08/2016 CLINICAL DATA:  Acute renal failure.  Missed dialysis. EXAM: PORTABLE CHEST 1 VIEW COMPARISON:  02/03/2008 FINDINGS: Surgical changes from bypass surgery are noted. The heart is enlarged. There is tortuosity and ectasia of the thoracic aorta. There are bilateral pleural effusions, right greater than left. Perihilar/fat plane airspace process, asymmetric on the right. This is likely asymmetric pulmonary edema. No significant bony findings. IMPRESSION: Cardiac enlargement, bilateral pleural effusions and probable asymmetric pattern of perihilar pulmonary edema. Electronically Signed   By: Marijo Sanes M.D.   On: 02/08/2016 14:05    EKG: Orders placed or performed during the hospital encounter of 02/08/16  . EKG 12-Lead  . EKG 12-Lead    IMPRESSION AND  PLAN:  * Acute on chronic renal failure  Pulmonary edema   Acute hypoxic respiratory failure    Patient will need to be initiated on hemodialysis.   Nephrologist is aware as per ER physician.   Continue supplemental oxygen.  * hypertension   Continue home medication, currently stable.   Hold Lasix.  * history of heart transplant   Continue his transplant medication as he was taking at home.   He follows at [facility] for this.  * Hyperlipidemia    continue statin.  * Chronic anemia   Due to chronic renal failure. Continue monitoring.  All the records are reviewed and case discussed with ED provider. Management plans discussed with the patient, family and they are in agreement.  CODE STATUS: full. Code Status History    This patient does not have a recorded code status. Please follow your organizational policy for patients in this situation.      patient's wife was present in the room during my visit.  TOTAL TIME TAKING CARE OF THIS PATIENT: 50 minutes.    Vaughan Basta M.D on 02/08/2016   Between 7am to 6pm - Pager - [phone]  After 6pm go to [email or web address] - password [email or web address] [place] [facility]  Office  [phone]  CC: Primary care physician; Diamond Nickel, MD   Note: This dictation was prepared with Dragon dictation along with smaller phrase technology. Any transcriptional errors that result from this process are unintentional.

## 2016-02-08 NOTE — ED Triage Notes (Signed)
From dr office.  With sob esp with exertion.  Increased swellihng legs bilat for 1 month.

## 2016-02-09 LAB — BASIC METABOLIC PANEL
Anion gap: 14 (ref 5–15)
BUN: 64 mg/dL — ABNORMAL HIGH (ref 6–20)
CALCIUM: 8 mg/dL — AB (ref 8.9–10.3)
CO2: 23 mmol/L (ref 22–32)
Chloride: 94 mmol/L — ABNORMAL LOW (ref 101–111)
Creatinine, Ser: 7.11 mg/dL — ABNORMAL HIGH (ref 0.61–1.24)
GFR calc Af Amer: 8 mL/min — ABNORMAL LOW (ref >60)
GFR, EST NON AFRICAN AMERICAN: 7 mL/min — AB (ref >60)
GLUCOSE: 109 mg/dL — AB (ref 65–99)
POTASSIUM: 3.9 mmol/L (ref 3.5–5.1)
Sodium: 131 mmol/L — ABNORMAL LOW (ref 135–145)

## 2016-02-09 LAB — CBC
HEMATOCRIT: 25.4 % — AB (ref 40.0–52.0)
Hemoglobin: 8.8 g/dL — ABNORMAL LOW (ref 13.0–18.0)
MCH: 28.9 pg (ref 26.0–34.0)
MCHC: 34.5 g/dL (ref 32.0–36.0)
MCV: 83.7 fL (ref 80.0–100.0)
Platelets: 305 10*3/uL (ref 150–440)
RBC: 3.04 MIL/uL — ABNORMAL LOW (ref 4.40–5.90)
RDW: 13.8 % (ref 11.5–14.5)
WBC: 10.9 10*3/uL — ABNORMAL HIGH (ref 3.8–10.6)

## 2016-02-09 LAB — TRANSFERRIN: TRANSFERRIN: 140 mg/dL — AB (ref 180–329)

## 2016-02-09 MED ORDER — NEPRO/CARBSTEADY PO LIQD
237.0000 mL | Freq: Two times a day (BID) | ORAL | Status: DC
  Administered 2016-02-09 – 2016-02-12 (×6): 237 mL via ORAL

## 2016-02-09 MED ORDER — SODIUM CHLORIDE 0.9 % IV SOLN
200.0000 mg | Freq: Once | INTRAVENOUS | Status: AC
  Administered 2016-02-09: 200 mg via INTRAVENOUS
  Filled 2016-02-09: qty 10

## 2016-02-09 MED ORDER — LOSARTAN POTASSIUM 50 MG PO TABS
50.0000 mg | ORAL_TABLET | Freq: Every day | ORAL | Status: DC
  Administered 2016-02-09 – 2016-02-13 (×5): 50 mg via ORAL
  Filled 2016-02-09 (×5): qty 1

## 2016-02-09 NOTE — Progress Notes (Signed)
Post hd vitals 

## 2016-02-09 NOTE — Progress Notes (Signed)
Subjective:   Patient seen during dialysis Tolerating well    HEMODIALYSIS FLOWSHEET:  Blood Flow Rate (mL/min): 250 mL/min Arterial Pressure (mmHg): -90 mmHg Venous Pressure (mmHg): 70 mmHg Transmembrane Pressure (mmHg): 60 mmHg Ultrafiltration Rate (mL/min): 750 mL/min Dialysate Flow Rate (mL/min): 500 ml/min Conductivity: Machine : 14.2 Conductivity: Machine : 14.2 Dialysis Fluid Bolus: Normal Saline Bolus Amount (mL): 250 mL (prime) Dialysate Change:  (3k) Intra-Hemodialysis Comments: 754. Resting    Objective:  Vital signs in last 24 hours:  Temp:  [97.4 F (36.3 C)-98.5 F (36.9 C)] 98.3 F (36.8 C) (12/16 1102) Pulse Rate:  [75-92] 82 (12/16 1210) Resp:  [14-23] 17 (12/16 1210) BP: (103-133)/(64-92) 119/76 (12/16 1210) SpO2:  [85 %-99 %] 95 % (12/16 1210) Weight:  [53.8 kg (118 lb 9.7 oz)-56.2 kg (124 lb)] 56 kg (123 lb 7.3 oz) (12/16 1102)  Weight change:  Filed Weights   02/08/16 1933 02/08/16 2100 02/09/16 1102  Weight: 55 kg (121 lb 4.1 oz) 53.8 kg (118 lb 9.7 oz) 56 kg (123 lb 7.3 oz)    Intake/Output:    Intake/Output Summary (Last 24 hours) at 02/09/16 1218 Last data filed at 02/09/16 0953  Gross per 24 hour  Intake              240 ml  Output              100 ml  Net              140 ml     Physical Exam: General: NAD., laying in bed  HEENT anicteric  Neck supple  Pulm/lungs Normal effort, bibasilar crackles  CVS/Heart regular  Abdomen:  Soft, NT  Extremities: ++ edema  Neurologic: Alert, oreinted  Skin: No acute rashes  Access: Rt IJ PC       Basic Metabolic Panel:   Recent Labs Lab 02/08/16 1337 02/08/16 1935 02/09/16 0600  NA 119*  --  131*  K 4.0  --  3.9  CL 86*  --  94*  CO2 18*  --  23  GLUCOSE 124*  --  109*  BUN 88*  --  64*  CREATININE 9.12*  --  7.11*  CALCIUM 7.4*  --  8.0*  PHOS  --  6.8*  --      CBC:  Recent Labs Lab 02/08/16 1337 02/09/16 0600  WBC 13.4* 10.9*  NEUTROABS 11.3*  --   HGB 8.9*  8.8*  HCT 26.7* 25.4*  MCV 83.7 83.7  PLT 303 305      Microbiology:  No results found for this or any previous visit (from the past 720 hour(s)).  Coagulation Studies: No results for input(s): LABPROT, INR in the last 72 hours.  Urinalysis: No results for input(s): COLORURINE, LABSPEC, PHURINE, GLUCOSEU, HGBUR, BILIRUBINUR, KETONESUR, PROTEINUR, UROBILINOGEN, NITRITE, LEUKOCYTESUR in the last 72 hours.  Invalid input(s): APPERANCEUR    Imaging: Dg Chest Port 1 View  Result Date: 02/08/2016 CLINICAL DATA:  Acute renal failure.  Missed dialysis. EXAM: PORTABLE CHEST 1 VIEW COMPARISON:  02/03/2008 FINDINGS: Surgical changes from bypass surgery are noted. The heart is enlarged. There is tortuosity and ectasia of the thoracic aorta. There are bilateral pleural effusions, right greater than left. Perihilar/fat plane airspace process, asymmetric on the right. This is likely asymmetric pulmonary edema. No significant bony findings. IMPRESSION: Cardiac enlargement, bilateral pleural effusions and probable asymmetric pattern of perihilar pulmonary edema. Electronically Signed   By: Marijo Sanes M.D.   On: 02/08/2016 14:05  Medications:    . amLODipine  10 mg Oral Daily  . anticoagulant sodium citrate  5 mL Intravenous Daily  . carvedilol  25 mg Oral Q12H  . ferrous sulfate  325 mg Oral BID  . iron sucrose  200 mg Intravenous Once in dialysis  . mycophenolate  500 mg Oral BID  . pantoprazole  40 mg Oral Daily  . pravastatin  40 mg Oral QHS  . sodium chloride flush  3 mL Intravenous Q12H  . tacrolimus  0.5 mg Oral BID  . tuberculin  5 Units Intradermal Once   acetaminophen, HYDROcodone-acetaminophen  Assessment/ Plan:  65 y.o. male with  medical problems of Chronic kidney disease stage V followed at [facility], heart transplant at [facility] in February 2010, hypertension, HIT  who was admitted to [facility] on 02/08/2016 for evaluation of shortness of breath and lower extremity edema.  1.  End-stage renal disease, secondary to HTN, atherosclerosis and CNI use 2. Severe hyponatremia 3. Volume overload 4. Pulmonary edema 5. Anemia 6. H/o heart transplant Feb 2010 7. HTN  Plan: Patient seen during dialysis Tolerating well  1st HD on 02/08/16 Discharge planning.- for [facility] PPD and order hepatitis studies Start losartan, d/c amlodipine     LOS: 1 Ralph Allison 12/16/201712:18 PM

## 2016-02-09 NOTE — Progress Notes (Signed)
[place] at [place] NAME: Ralph Allison    MR#:  [id number]  DATE OF BIRTH:  11/04/1950  SUBJECTIVE:   Patient here due to leg swelling and shortness of breath due to pulmonary edema. Patient has progressed to ESRD and started on hemodialysis yesterday. Lower extremity edema improved after dialysis but still has some hypoxia. Family at bedside. No other acute complaints presently.  REVIEW OF SYSTEMS:    Review of Systems  Constitutional: Negative for chills and fever.  HENT: Negative for congestion and tinnitus.   Eyes: Negative for blurred vision and double vision.  Respiratory: Positive for shortness of breath. Negative for cough and wheezing.   Cardiovascular: Positive for leg swelling. Negative for chest pain, orthopnea and PND.  Gastrointestinal: Negative for abdominal pain, diarrhea, nausea and vomiting.  Genitourinary: Negative for dysuria and hematuria.  Neurological: Negative for dizziness, sensory change and focal weakness.  All other systems reviewed and are negative.   Nutrition: Cardiac, Renal diet Tolerating Diet: Yes Tolerating PT: Ambulatory   DRUG ALLERGIES:   Allergies  Allergen Reactions  . Heparin     Heart transplant    VITALS:  Blood pressure 107/72, pulse 88, temperature 98 F (36.7 C), temperature source Oral, resp. rate 16, height $RemoveBe'5\' 2"'maZHKrKWY$  (1.575 m), weight 54.7 kg (120 lb 9.5 oz), SpO2 90 %.  PHYSICAL EXAMINATION:   Physical Exam  GENERAL:  65 y.o.-year-old patient lying in the bed in mild resp. Distress.   EYES: Pupils equal, round, reactive to light and accommodation. No scleral icterus. Extraocular muscles intact.  HEENT: Head atraumatic, normocephalic. Oropharynx and nasopharynx clear.  NECK:  Supple, no jugular venous distention. No thyroid enlargement, no tenderness.  LUNGS: Normal breath sounds bilaterally, no wheezing, no rhonchi, diffuse rales midway up lung fields b/l. No use of accessory  muscles of respiration.  CARDIOVASCULAR: S1, S2 normal. No murmurs, rubs, or gallops.  ABDOMEN: Soft, nontender, nondistended. Bowel sounds present. No organomegaly or mass.  EXTREMITIES: No cyanosis, clubbing or mild edema b/l NEUROLOGIC: Cranial nerves II through XII are intact. No focal Motor or sensory deficits b/l.   PSYCHIATRIC: The patient is alert and oriented x 3.  SKIN: No obvious rash, lesion, or ulcer.   Right upper ext. Perm cath in place for HD.     LABORATORY PANEL:   CBC  Recent Labs Lab 02/09/16 0600  WBC 10.9*  HGB 8.8*  HCT 25.4*  PLT 305   ------------------------------------------------------------------------------------------------------------------  Chemistries   Recent Labs Lab 02/09/16 0600  NA 131*  K 3.9  CL 94*  CO2 23  GLUCOSE 109*  BUN 64*  CREATININE 7.11*  CALCIUM 8.0*   ------------------------------------------------------------------------------------------------------------------  Cardiac Enzymes  Recent Labs Lab 02/08/16 1337  TROPONINI <0.03   ------------------------------------------------------------------------------------------------------------------  RADIOLOGY:  Dg Chest Port 1 View  Result Date: 02/08/2016 CLINICAL DATA:  Acute renal failure.  Missed dialysis. EXAM: PORTABLE CHEST 1 VIEW COMPARISON:  02/03/2008 FINDINGS: Surgical changes from bypass surgery are noted. The heart is enlarged. There is tortuosity and ectasia of the thoracic aorta. There are bilateral pleural effusions, right greater than left. Perihilar/fat plane airspace process, asymmetric on the right. This is likely asymmetric pulmonary edema. No significant bony findings. IMPRESSION: Cardiac enlargement, bilateral pleural effusions and probable asymmetric pattern of perihilar pulmonary edema. Electronically Signed   By: Marijo Sanes M.D.   On: 02/08/2016 14:05     ASSESSMENT AND PLAN:   65 year old male with past medical history of  cardiomyopathy  status post heart transplant, diabetes, chronic kidney disease who presented to the hospital due to worsening lower extremity edema and shortness of breath.   1. Acute respiratory failure with hypoxia-secondary to volume overload and pulmonary edema. -Continue O2 supplementation. Continue dialysis to get fluid removed and wean oxygen as tolerated.  2. Acute on chronic renal failure-patient has progressed now to ESRD as per nephrology. Charolette Forward catheter has been placed and patient has been initiated on hemodialysis. -Continue current further care as per nephrology. Patient is actually on a transplant list at [facility] to have a kidney transplant in the near future. Next  3. History of cardiomyopathy and heart transplant-continue CellCept, Prograf.  4. Essential hypertension-continue losartan, Coreg  Discussed plan of care with the patient and patient's family at bedside.   All the records are reviewed and case discussed with Care Management/Social Worker. Management plans discussed with the patient, family and they are in agreement.  CODE STATUS: Full  DVT Prophylaxis: Teds and SCDs  TOTAL TIME TAKING CARE OF THIS PATIENT: 30 minutes.   POSSIBLE D/C IN 2-3 DAYS, DEPENDING ON CLINICAL CONDITION.   Henreitta Leber M.D on 02/09/2016 at 3:25 PM  Between 7am to 6pm - Pager - [phone]  After 6pm go to [email or web address] - Proofreader  [employer] [facility] [place] [facility]  Office  [phone]  CC: Primary care physician; Diamond Nickel, MD

## 2016-02-09 NOTE — Progress Notes (Signed)
End of hd tx  

## 2016-02-09 NOTE — Progress Notes (Signed)
Pre hd info 

## 2016-02-09 NOTE — Progress Notes (Signed)
Post hd assessment 

## 2016-02-09 NOTE — Progress Notes (Signed)
Start of hd 

## 2016-02-09 NOTE — Plan of Care (Signed)
Problem: Fluid Volume: Goal: Ability to maintain a balanced intake and output will improve Outcome: Not Met (add Reason) Pt is [employer]

## 2016-02-09 NOTE — Progress Notes (Signed)
Pre hd assessment  

## 2016-02-10 ENCOUNTER — Inpatient Hospital Stay: Payer: Medicare Other

## 2016-02-10 LAB — HEPATITIS B SURFACE ANTIBODY,QUALITATIVE: Hep B S Ab: NONREACTIVE

## 2016-02-10 LAB — HEPATITIS B CORE ANTIBODY, TOTAL: Hep B Core Total Ab: NEGATIVE

## 2016-02-10 LAB — HEPATITIS B SURFACE ANTIGEN: HEP B S AG: NEGATIVE

## 2016-02-10 LAB — HEPATITIS C ANTIBODY

## 2016-02-10 NOTE — Progress Notes (Signed)
Post hd assessment 

## 2016-02-10 NOTE — Progress Notes (Signed)
Subjective:   Patient seen during dialysis Tolerating well    HEMODIALYSIS FLOWSHEET:  Blood Flow Rate (mL/min): 300 mL/min Arterial Pressure (mmHg): -110 mmHg Venous Pressure (mmHg): 100 mmHg Transmembrane Pressure (mmHg): 80 mmHg Ultrafiltration Rate (mL/min): 830 mL/min Dialysate Flow Rate (mL/min): 600 ml/min Conductivity: Machine : 14.1 Conductivity: Machine : 14.1 Dialysis Fluid Bolus: Normal Saline Bolus Amount (mL): 250 mL Dialysate Change:  (3k) Intra-Hemodialysis Comments: 2500. tx ended   Longer treatment today for 3 hours. 2000 cc fluid removed Patient still desaturates to mid 80s with movement  Objective:  Vital signs in last 24 hours:  Temp:  [97.8 F (36.6 C)-99.1 F (37.3 C)] 99.1 F (37.3 C) (12/17 1202) Pulse Rate:  [85-96] 96 (12/17 1202) Resp:  [16-23] 18 (12/17 1202) BP: (107-142)/(66-89) 142/72 (12/17 1202) SpO2:  [80 %-97 %] 93 % (12/17 1202) Weight:  [53.6 kg (118 lb 2.7 oz)-55.7 kg (122 lb 12.7 oz)] 54 kg (119 lb 0.8 oz) (12/17 1200)  Weight change: -0.246 kg (-8.7 oz) Filed Weights   02/10/16 0818 02/10/16 1125 02/10/16 1200  Weight: 55.7 kg (122 lb 12.7 oz) 53.6 kg (118 lb 2.7 oz) 54 kg (119 lb 0.8 oz)    Intake/Output:    Intake/Output Summary (Last 24 hours) at 02/10/16 1335 Last data filed at 02/10/16 1225  Gross per 24 hour  Intake              477 ml  Output             2025 ml  Net            -1548 ml     Physical Exam: General: NAD., laying in bed  HEENT anicteric  Neck supple  Pulm/lungs Normal effort, bibasilar crackles, nasal cannula oxygen  CVS/Heart regular  Abdomen:  Soft, NT  Extremities: + edema  Neurologic: Alert, oreinted  Skin: No acute rashes  Access: Rt IJ PC       Basic Metabolic Panel:   Recent Labs Lab 02/08/16 1337 02/08/16 1935 02/09/16 0600  NA 119*  --  131*  K 4.0  --  3.9  CL 86*  --  94*  CO2 18*  --  23  GLUCOSE 124*  --  109*  BUN 88*  --  64*  CREATININE 9.12*  --  7.11*   CALCIUM 7.4*  --  8.0*  PHOS  --  6.8*  --      CBC:  Recent Labs Lab 02/08/16 1337 02/09/16 0600  WBC 13.4* 10.9*  NEUTROABS 11.3*  --   HGB 8.9* 8.8*  HCT 26.7* 25.4*  MCV 83.7 83.7  PLT 303 305      Microbiology:  No results found for this or any previous visit (from the past 720 hour(s)).  Coagulation Studies: No results for input(s): LABPROT, INR in the last 72 hours.  Urinalysis: No results for input(s): COLORURINE, LABSPEC, PHURINE, GLUCOSEU, HGBUR, BILIRUBINUR, KETONESUR, PROTEINUR, UROBILINOGEN, NITRITE, LEUKOCYTESUR in the last 72 hours.  Invalid input(s): APPERANCEUR    Imaging: Dg Chest Port 1 View  Result Date: 02/08/2016 CLINICAL DATA:  Acute renal failure.  Missed dialysis. EXAM: PORTABLE CHEST 1 VIEW COMPARISON:  02/03/2008 FINDINGS: Surgical changes from bypass surgery are noted. The heart is enlarged. There is tortuosity and ectasia of the thoracic aorta. There are bilateral pleural effusions, right greater than left. Perihilar/fat plane airspace process, asymmetric on the right. This is likely asymmetric pulmonary edema. No significant bony findings. IMPRESSION: Cardiac enlargement, bilateral pleural effusions  and probable asymmetric pattern of perihilar pulmonary edema. Electronically Signed   By: Marijo Sanes M.D.   On: 02/08/2016 14:05     Medications:    . anticoagulant sodium citrate  5 mL Intravenous Daily  . carvedilol  25 mg Oral Q12H  . feeding supplement (NEPRO CARB STEADY)  237 mL Oral BID BM  . ferrous sulfate  325 mg Oral BID  . losartan  50 mg Oral QHS  . mycophenolate  500 mg Oral BID  . pantoprazole  40 mg Oral Daily  . pravastatin  40 mg Oral QHS  . sodium chloride flush  3 mL Intravenous Q12H  . tacrolimus  0.5 mg Oral BID  . tuberculin  5 Units Intradermal Once   acetaminophen, HYDROcodone-acetaminophen  Assessment/ Plan:  65 y.o. male with  medical problems of Chronic kidney disease stage V followed at [facility], heart  transplant at [facility] in February 2010, hypertension, HIT  who was admitted to [facility] on 02/08/2016 for evaluation of shortness of breath and lower extremity edema.  1. End-stage renal disease, secondary to HTN, atherosclerosis and CNI use 2. Severe hyponatremia 3. Volume overload 4. Pulmonary edema 5. Anemia 6. H/o heart transplant Feb 2010 7. HTN  Plan: Patient seen during dialysis- 3 hours on Sunday Tolerating well  1st HD on 02/08/16 Discharge planning.- for [address] [facility] PPD and hepatitis studies ordered Start losartan, d/c amlodipine  Next HD possibly tuesday   LOS: 2 Ralph Allison 12/17/20171:35 PM

## 2016-02-10 NOTE — Progress Notes (Signed)
Start of hd 

## 2016-02-10 NOTE — Progress Notes (Signed)
[place] at [place] NAME: Ralph Allison    MR#:  [id number]  DATE OF BIRTH:  Jul 27, 1950  SUBJECTIVE:   Patient here due to leg swelling and shortness of breath due to pulmonary edema. Patient has progressed to ESRD and started on hemodialysis now. Lower extremity edema improved after dialysis but still continues to be hypoxic.  Had HD again today and had 2 L removed.  Feels better.    REVIEW OF SYSTEMS:    Review of Systems  Constitutional: Negative for chills and fever.  HENT: Negative for congestion and tinnitus.   Eyes: Negative for blurred vision and double vision.  Respiratory: Positive for shortness of breath. Negative for cough and wheezing.   Cardiovascular: Positive for leg swelling. Negative for chest pain, orthopnea and PND.  Gastrointestinal: Negative for abdominal pain, diarrhea, nausea and vomiting.  Genitourinary: Negative for dysuria and hematuria.  Neurological: Negative for dizziness, sensory change and focal weakness.  All other systems reviewed and are negative.   Nutrition: Cardiac, Renal diet Tolerating Diet: Yes Tolerating PT: Ambulatory   DRUG ALLERGIES:   Allergies  Allergen Reactions  . Heparin     Heart transplant    VITALS:  Blood pressure (!) 142/72, pulse 96, temperature 99.1 F (37.3 C), temperature source Oral, resp. rate 18, height $RemoveBe'5\' 2"'ahECtsxAE$  (1.575 m), weight 54 kg (119 lb 0.8 oz), SpO2 93 %.  PHYSICAL EXAMINATION:   Physical Exam  GENERAL:  65 y.o.-year-old patient lying in the bed in NAD.    EYES: Pupils equal, round, reactive to light and accommodation. No scleral icterus. Extraocular muscles intact.  HEENT: Head atraumatic, normocephalic. Oropharynx and nasopharynx clear.  NECK:  Supple, no jugular venous distention. No thyroid enlargement, no tenderness.  LUNGS: Good A/E b/l, no wheezing, no rhonchi, bibasilar rales. No use of accessory muscles of respiration.  CARDIOVASCULAR: S1, S2 normal.  No murmurs, rubs, or gallops.  ABDOMEN: Soft, nontender, nondistended. Bowel sounds present. No organomegaly or mass.  EXTREMITIES: No cyanosis, clubbing or mild edema b/l NEUROLOGIC: Cranial nerves II through XII are intact. No focal Motor or sensory deficits b/l.   PSYCHIATRIC: The patient is alert and oriented x 3.  SKIN: No obvious rash, lesion, or ulcer.   Right chest wall  Perm cath in place for HD.     LABORATORY PANEL:   CBC  Recent Labs Lab 02/09/16 0600  WBC 10.9*  HGB 8.8*  HCT 25.4*  PLT 305   ------------------------------------------------------------------------------------------------------------------  Chemistries   Recent Labs Lab 02/09/16 0600  NA 131*  K 3.9  CL 94*  CO2 23  GLUCOSE 109*  BUN 64*  CREATININE 7.11*  CALCIUM 8.0*   ------------------------------------------------------------------------------------------------------------------  Cardiac Enzymes  Recent Labs Lab 02/08/16 1337  TROPONINI <0.03   ------------------------------------------------------------------------------------------------------------------  RADIOLOGY:  Dg Chest Port 1 View  Result Date: 02/08/2016 CLINICAL DATA:  Acute renal failure.  Missed dialysis. EXAM: PORTABLE CHEST 1 VIEW COMPARISON:  02/03/2008 FINDINGS: Surgical changes from bypass surgery are noted. The heart is enlarged. There is tortuosity and ectasia of the thoracic aorta. There are bilateral pleural effusions, right greater than left. Perihilar/fat plane airspace process, asymmetric on the right. This is likely asymmetric pulmonary edema. No significant bony findings. IMPRESSION: Cardiac enlargement, bilateral pleural effusions and probable asymmetric pattern of perihilar pulmonary edema. Electronically Signed   By: Marijo Sanes M.D.   On: 02/08/2016 14:05     ASSESSMENT AND PLAN:   65 year old male with past medical history  of cardiomyopathy status post heart transplant, diabetes, chronic  kidney disease who presented to the hospital due to worsening lower extremity edema and shortness of breath.   1. Acute respiratory failure with hypoxia-secondary to volume overload and pulmonary edema. -Continue O2 supplementation. Continue dialysis to get fluid removed and wean oxygen as tolerated.  2. Acute on chronic renal failure-patient has progressed now to ESRD as per nephrology. Charolette Forward catheter has been placed and patient has been initiated on hemodialysis. Had HD again today to have 2 L removed.   -Continue current further care as per nephrology. Patient is actually on a transplant list at [facility] to have a kidney transplant in the near future.   3. History of cardiomyopathy and heart transplant-continue CellCept, Prograf.  4. Essential hypertension-continue losartan, Coreg  Discussed plan of care with the patient and son over the phone.   All the records are reviewed and case discussed with Care Management/Social Worker. Management plans discussed with the patient, family and they are in agreement.  CODE STATUS: Full  DVT Prophylaxis: Teds and SCDs  TOTAL TIME TAKING CARE OF THIS PATIENT: 30 minutes.   POSSIBLE D/C IN 1-2 DAYS, DEPENDING ON CLINICAL CONDITION.   Henreitta Leber M.D on 02/10/2016 at 1:27 PM  Between 7am to 6pm - Pager - [phone]  After 6pm go to [email or web address] - Proofreader  [facility] [place]  Office  [phone]  CC: Primary care physician; Diamond Nickel, MD

## 2016-02-10 NOTE — Progress Notes (Signed)
Post hd vitals 

## 2016-02-10 NOTE — Progress Notes (Signed)
Pre hd assessment  

## 2016-02-10 NOTE — Progress Notes (Signed)
  End of hd 

## 2016-02-10 NOTE — Progress Notes (Signed)
Pre hd info 

## 2016-02-10 NOTE — Plan of Care (Signed)
Problem: Fluid Volume: Goal: Ability to maintain a balanced intake and output will improve Outcome: Not Progressing Inadequate urinary output

## 2016-02-11 NOTE — Progress Notes (Signed)
Second HD treatment 02/10/2016    02/10/16 0825 02/10/16 0859 02/10/16 0915  During Hemodialysis Assessment  Arterial Pressure (mmHg) -80 mmHg -100 mmHg -100 mmHg  Venous Pressure (mmHg) 70 mmHg 90 mmHg 100 mmHg  Transmembrane Pressure (mmHg) 60 mmHg 70 mmHg 70 mmHg  Ultrafiltration Rate (mL/min) 830 mL/min 830 mL/min 830 mL/min  Dialysate Flow Rate (mL/min) 600 ml/min 600 ml/min 600 ml/min  Conductivity: Machine  14 14.1 13.6  HD Safety Checks Performed Yes Yes Yes  Dialysis Fluid Bolus Normal Saline --  --   Bolus Amount (mL) 250 mL (prime) --  --   Dialysate Change (3k) --  --   Intra-Hemodialysis Comments start of hd, pt alert, no c/o, vss.  470. Resting 729. Resting     02/10/16 0930  During Hemodialysis Assessment  Arterial Pressure (mmHg) -100 mmHg  Venous Pressure (mmHg) 90 mmHg  Transmembrane Pressure (mmHg) 80 mmHg  Ultrafiltration Rate (mL/min) 830 mL/min  Dialysate Flow Rate (mL/min) 600 ml/min  Conductivity: Machine  14.6  HD Safety Checks Performed Yes  Dialysis Fluid Bolus --   Bolus Amount (mL) --   Dialysate Change --   Intra-Hemodialysis Comments 1013. Resting

## 2016-02-11 NOTE — Progress Notes (Signed)
PPD Results   02/10/16 2121 02/10/16 2200  PPD Results  Does patient have an induration at the injection site? No No  Induration(mm) 0 mm 0 mm  Name of Physician Notified --  N/A

## 2016-02-11 NOTE — Progress Notes (Signed)
Post HD assessment, unchanged

## 2016-02-11 NOTE — Progress Notes (Addendum)
HD completed without issue. Goal 1.5L met. Tolerated well. HD cath locked with na citrate post treatment.

## 2016-02-11 NOTE — Progress Notes (Signed)
Pre dialysis  

## 2016-02-11 NOTE — Care Management (Signed)
Hepatitis  Results for ANES, RIGEL (MRN [id number]) as of 02/11/2016 09:59  Ref. Range 02/08/2016 19:35  Hepatitis B Surface Ag Latest Ref Range: Negative  Negative  Hep B S Ab Unknown Non Reactive  Hep B Core Ab, Tot Latest Ref Range: Negative  Negative    Results for EMONI, YANG (MRN [id number]) as of 02/11/2016 09:59  Ref. Range 02/08/2016 19:35  HCV Ab Latest Ref Range: 0.0 - 0.9 s/co ratio <0.1

## 2016-02-11 NOTE — Progress Notes (Signed)
First HD treatment- 02/09/2016    02/09/16 1113 02/09/16 1140 02/09/16 1210  During Hemodialysis Assessment  Blood Flow Rate (mL/min) 250 mL/min 250 mL/min 250 mL/min  Arterial Pressure (mmHg) -80 mmHg -90 mmHg -90 mmHg  Venous Pressure (mmHg) 70 mmHg 70 mmHg 70 mmHg  Transmembrane Pressure (mmHg) 60 mmHg 60 mmHg 60 mmHg  Ultrafiltration Rate (mL/min) 750 mL/min 750 mL/min 750 mL/min  Dialysate Flow Rate (mL/min) 500 ml/min 500 ml/min 500 ml/min  Conductivity: Machine  14.1 14.1 14.2  HD Safety Checks Performed Yes Yes Yes  Dialysis Fluid Bolus Normal Saline --  --   Bolus Amount (mL) 250 mL (prime) --  --   Dialysate Change (3k) (3k) --   Intra-Hemodialysis Comments start of hd, pt alert, no c/o, vss, 2nd hd tx 363. pt resting, no c/o, vss.  754. Resting     02/09/16 1240  During Hemodialysis Assessment  Blood Flow Rate (mL/min) 250 mL/min  Arterial Pressure (mmHg) -90 mmHg  Venous Pressure (mmHg) 70 mmHg  Transmembrane Pressure (mmHg) 70 mmHg  Ultrafiltration Rate (mL/min) 750 mL/min  Dialysate Flow Rate (mL/min) 500 ml/min  Conductivity: Machine  14.2  HD Safety Checks Performed Yes  Dialysis Fluid Bolus --   Bolus Amount (mL) --   Dialysate Change --   Intra-Hemodialysis Comments 1098. pt alert, no c/o, vss.

## 2016-02-11 NOTE — Progress Notes (Signed)
[place] at [place] NAME: Ralph Allison    MR#:  [id number]  DATE OF BIRTH:  [birth year]  SUBJECTIVE:   Patient here due to leg swelling and shortness of breath due to pulmonary edema. Still continues to be hypoxic. Lower extremity edema has improved. Plan to have hemodialysis again today. No other acute events overnight.    REVIEW OF SYSTEMS:    Review of Systems  Constitutional: Negative for chills and fever.  HENT: Negative for congestion and tinnitus.   Eyes: Negative for blurred vision and double vision.  Respiratory: Positive for shortness of breath. Negative for cough and wheezing.   Cardiovascular: Positive for leg swelling. Negative for chest pain, orthopnea and PND.  Gastrointestinal: Negative for abdominal pain, diarrhea, nausea and vomiting.  Genitourinary: Negative for dysuria and hematuria.  Neurological: Negative for dizziness, sensory change and focal weakness.  All other systems reviewed and are negative.   Nutrition: Cardiac, Renal diet Tolerating Diet: Yes Tolerating PT: Ambulatory   DRUG ALLERGIES:   Allergies  Allergen Reactions  . Heparin     Heart transplant    VITALS:  Blood pressure 118/85, pulse 90, temperature 98.8 F (37.1 C), temperature source Oral, resp. rate 18, height $RemoveBe'5\' 2"'ZnvADNQqz$  (1.575 m), weight 54 kg (119 lb 0.8 oz), SpO2 91 %.  PHYSICAL EXAMINATION:   Physical Exam  GENERAL:  65 y.o.-year-old patient lying in the bed in NAD.    EYES: Pupils equal, round, reactive to light and accommodation. No scleral icterus. Extraocular muscles intact.  HEENT: Head atraumatic, normocephalic. Oropharynx and nasopharynx clear.  NECK:  Supple, no jugular venous distention. No thyroid enlargement, no tenderness.  LUNGS: Good A/E b/l, no wheezing, no rhonchi, bibasilar rales. No use of accessory muscles of respiration.  CARDIOVASCULAR: S1, S2 normal. No murmurs, rubs, or gallops.  ABDOMEN: Soft, nontender,  nondistended. Bowel sounds present. No organomegaly or mass.  EXTREMITIES: No cyanosis, clubbing or mild edema b/l NEUROLOGIC: Cranial nerves II through XII are intact. No focal Motor or sensory deficits b/l.   PSYCHIATRIC: The patient is alert and oriented x 3.  SKIN: No obvious rash, lesion, or ulcer.   Right chest wall  Perm cath in place for HD.     LABORATORY PANEL:   CBC  Recent Labs Lab 02/09/16 0600  WBC 10.9*  HGB 8.8*  HCT 25.4*  PLT 305   ------------------------------------------------------------------------------------------------------------------  Chemistries   Recent Labs Lab 02/09/16 0600  NA 131*  K 3.9  CL 94*  CO2 23  GLUCOSE 109*  BUN 64*  CREATININE 7.11*  CALCIUM 8.0*   ------------------------------------------------------------------------------------------------------------------  Cardiac Enzymes  Recent Labs Lab 02/08/16 1337  TROPONINI <0.03   ------------------------------------------------------------------------------------------------------------------  RADIOLOGY:  Dg Chest 2 View  Result Date: 02/10/2016 CLINICAL DATA:  Shortness of breath.  Leg swelling.  On dialysis. EXAM: CHEST  2 VIEW COMPARISON:  02/08/2016. FINDINGS: Interval right jugular large-bore catheter with its tip at the superior cavoatrial junction. No pneumothorax. Stable enlargement of the cardiac silhouette. Patchy opacity in both lungs with improvement on the right and progression on the left. Small to moderate-sized bilateral pleural effusions, mildly increased on the left. Thoracolumbar spine degenerative changes. Median sternotomy wires. IMPRESSION: 1. Progressive alveolar edema or pneumonia on the left. 2. Improving alveolar edema or pneumonia on the right. 3. Small to moderate-sized bilateral pleural effusions, mildly increased on the left. 4. Stable mild cardiomegaly. Electronically Signed   By: Claudie Revering M.D.   On: 02/10/2016 15:30  ASSESSMENT  AND PLAN:   65 year old male with past medical history of cardiomyopathy status post heart transplant, diabetes, chronic kidney disease who presented to the hospital due to worsening lower extremity edema and shortness of breath.   1. Acute respiratory failure with hypoxia-secondary to volume overload and pulmonary edema. -Continue O2 supplementation. Cont. HD as tolerated and wean O2 as tolerated.  - repeat CXR yesterday still showing significant Pulm. Edema.   2. Acute on chronic renal failure-patient has progressed now to ESRD as per nephrology. Charolette Forward catheter has been placed and patient has been initiated on hemodialysis.  - plan for HD again today.   -Continue current further care as per nephrology. Patient is actually on a transplant list at [facility] to have a kidney transplant in the near future.   3. History of cardiomyopathy and heart transplant-continue CellCept, Prograf.  4. Essential hypertension-continue losartan, Coreg.   All the records are reviewed and case discussed with Care Management/Social Worker. Management plans discussed with the patient, family and they are in agreement.  CODE STATUS: Full  DVT Prophylaxis: Teds and SCDs  TOTAL TIME TAKING CARE OF THIS PATIENT: 25 minutes.   POSSIBLE D/C IN 2-3 DAYS, DEPENDING ON CLINICAL CONDITION.   Henreitta Leber M.D on 02/11/2016 at 3:45 PM  Between 7am to 6pm - Pager - [phone]  After 6pm go to [email or web address] - Proofreader  [facility]  Office  [phone]  CC: Primary care physician; Diamond Nickel, MD

## 2016-02-11 NOTE — Progress Notes (Signed)
Subjective:  Patient had hemodialysis yesterday.  He is due for another dialysis treatment today. Outpatient planning for placement at [facility] is pending. He is still having some shortness of breath.   Objective:  Vital signs in last 24 hours:  Temp:  [98.8 F (37.1 C)-99 F (37.2 C)] 98.8 F (37.1 C) (12/18 0900) Pulse Rate:  [84-92] 90 (12/18 0900) Resp:  [18] 18 (12/17 1929) BP: (118-138)/(68-85) 118/85 (12/18 0900) SpO2:  [89 %-91 %] 91 % (12/18 0900)  Weight change: -0.3 kg (-10.6 oz) Filed Weights   02/10/16 0818 02/10/16 1125 02/10/16 1200  Weight: 55.7 kg (122 lb 12.7 oz) 53.6 kg (118 lb 2.7 oz) 54 kg (119 lb 0.8 oz)    Intake/Output:    Intake/Output Summary (Last 24 hours) at 02/11/16 1616 Last data filed at 02/11/16 0545  Gross per 24 hour  Intake                0 ml  Output              100 ml  Net             -100 ml     Physical Exam: General: NAD., laying in bed  HEENT anicteric  Neck supple  Pulm/lungs asilar rales, normal effort  CVS/Heart Regular, no obvious rub  Abdomen:  Soft, NTND, BS present  Extremities: trace edema  Neurologic: Alert, oriented x 3, follows commands  Skin: No acute rashes  Access: Rt IJ PC       Basic Metabolic Panel:   Recent Labs Lab 02/08/16 1337 02/08/16 1935 02/09/16 0600  NA 119*  --  131*  K 4.0  --  3.9  CL 86*  --  94*  CO2 18*  --  23  GLUCOSE 124*  --  109*  BUN 88*  --  64*  CREATININE 9.12*  --  7.11*  CALCIUM 7.4*  --  8.0*  PHOS  --  6.8*  --      CBC:  Recent Labs Lab 02/08/16 1337 02/09/16 0600  WBC 13.4* 10.9*  NEUTROABS 11.3*  --   HGB 8.9* 8.8*  HCT 26.7* 25.4*  MCV 83.7 83.7  PLT 303 305      Microbiology:  No results found for this or any previous visit (from the past 720 hour(s)).  Coagulation Studies: No results for input(s): LABPROT, INR in the last 72 hours.  Urinalysis: No results for input(s): COLORURINE, LABSPEC, PHURINE, GLUCOSEU, HGBUR, BILIRUBINUR,  KETONESUR, PROTEINUR, UROBILINOGEN, NITRITE, LEUKOCYTESUR in the last 72 hours.  Invalid input(s): APPERANCEUR    Imaging: Dg Chest 2 View  Result Date: 02/10/2016 CLINICAL DATA:  Shortness of breath.  Leg swelling.  On dialysis. EXAM: CHEST  2 VIEW COMPARISON:  02/08/2016. FINDINGS: Interval right jugular large-bore catheter with its tip at the superior cavoatrial junction. No pneumothorax. Stable enlargement of the cardiac silhouette. Patchy opacity in both lungs with improvement on the right and progression on the left. Small to moderate-sized bilateral pleural effusions, mildly increased on the left. Thoracolumbar spine degenerative changes. Median sternotomy wires. IMPRESSION: 1. Progressive alveolar edema or pneumonia on the left. 2. Improving alveolar edema or pneumonia on the right. 3. Small to moderate-sized bilateral pleural effusions, mildly increased on the left. 4. Stable mild cardiomegaly. Electronically Signed   By: Claudie Revering M.D.   On: 02/10/2016 15:30     Medications:    . anticoagulant sodium citrate  5 mL Intravenous Daily  . carvedilol  25 mg Oral Q12H  . feeding supplement (NEPRO CARB STEADY)  237 mL Oral BID BM  . ferrous sulfate  325 mg Oral BID  . losartan  50 mg Oral QHS  . mycophenolate  500 mg Oral BID  . pantoprazole  40 mg Oral Daily  . pravastatin  40 mg Oral QHS  . sodium chloride flush  3 mL Intravenous Q12H  . tacrolimus  0.5 mg Oral BID   acetaminophen, HYDROcodone-acetaminophen  Assessment/ Plan:  65 y.o. male with  medical problems of Chronic kidney disease stage V followed at [facility], heart transplant at [facility] in February 2010, hypertension, HIT  who was admitted to [facility] on 02/08/2016 for evaluation of shortness of breath and lower extremity edema.  1. End-stage renal disease, secondary to HTN, atherosclerosis and CNI use, 1st dialysis 02/08/16 2. Severe hyponatremia 3. Volume overload 4. Pulmonary edema 5. Anemia of CKD 6. H/o heart  transplant Feb 2010 7. HTN  Plan: Patient still having some shortness of breath.  Therefore we have planned for hemodialysis today.  We will continue to perform ultrafiltration with dialysis.  Reassess patient for hemodialysis tomorrow.  Care management working on placement at [facility].  Continue to monitor electrolytes periodically.  We will continue to follow the patient's progress closely.   LOS: 3 Bakari Nikolai 12/18/20174:16 PM

## 2016-02-11 NOTE — Progress Notes (Addendum)
HD initiated without issue. Pt currently has no complaints. SOB with exertion only. Sats 90s on 4L 02.

## 2016-02-11 NOTE — Progress Notes (Signed)
Pre dialysis assessment 

## 2016-02-11 NOTE — Care Management (Signed)
Faxed referral information to Alda Lea with [facility]: H/P , PPD results, labs/Hep screen, EKG, Demographics with SS#, physician notes

## 2016-02-11 NOTE — Progress Notes (Addendum)
Continued documentation of second HD treatment on 12.17.2017   02/10/16 1000 02/10/16 1030 02/10/16 1100  During Hemodialysis Assessment  Blood Flow Rate (mL/min) 300 mL/min 300 mL/min 300 mL/min  Arterial Pressure (mmHg) -100 mmHg -110 mmHg -110 mmHg  Venous Pressure (mmHg) 100 mmHg 100 mmHg 100 mmHg  Transmembrane Pressure (mmHg) 70 mmHg 70 mmHg 80 mmHg  Ultrafiltration Rate (mL/min) 830 mL/min 830 mL/min 830 mL/min  Dialysate Flow Rate (mL/min) 600 ml/min 600 ml/min 600 ml/min  Conductivity: Machine  14.1 14.1 14.1  HD Safety Checks Performed Yes Yes Yes  Dialysis Fluid Bolus --  --  --   Bolus Amount (mL) --  --  --   Intra-Hemodialysis Comments 1344. Resting 1772. Resting 2165. Resting     02/10/16 1125  During Hemodialysis Assessment  Blood Flow Rate (mL/min) --   Arterial Pressure (mmHg) --   Venous Pressure (mmHg) --   Transmembrane Pressure (mmHg) --   Ultrafiltration Rate (mL/min) --   Dialysate Flow Rate (mL/min) --   Conductivity: Machine  --   HD Safety Checks Performed --   Dialysis Fluid Bolus Normal Saline  Bolus Amount (mL) 250 mL  Intra-Hemodialysis Comments 2500. tx ended

## 2016-02-11 NOTE — Progress Notes (Signed)
co   02/09/16 1313  During Hemodialysis Assessment  Dialysis Fluid Bolus Normal Saline  Bolus Amount (mL) 250 mL  Intra-Hemodialysis Comments 1500. tx ended  ontinued- 02/09/2016

## 2016-02-12 ENCOUNTER — Encounter: Payer: Self-pay | Admitting: Vascular Surgery

## 2016-02-12 NOTE — Progress Notes (Signed)
Pre Dialysis 

## 2016-02-12 NOTE — Progress Notes (Signed)
HD initiated without issue

## 2016-02-12 NOTE — Progress Notes (Signed)
Third HD Treatment 12.18.2017   02/11/16 2002 02/11/16 2017 02/11/16 2100  During Hemodialysis Assessment  Blood Flow Rate (mL/min) 300 mL/min 300 mL/min 300 mL/min  Arterial Pressure (mmHg) -90 mmHg -100 mmHg -110 mmHg  Venous Pressure (mmHg) 70 mmHg 90 mmHg 90 mmHg  Transmembrane Pressure (mmHg) 20 mmHg 60 mmHg 70 mmHg  Ultrafiltration Rate (mL/min) 1000 mL/min 1000 mL/min 1000 mL/min  Dialysate Flow Rate (mL/min) 600 ml/min 600 ml/min 600 ml/min  Conductivity: Machine  14.1 14.1 14.1  HD Safety Checks Performed Yes Yes Yes  Dialysis Fluid Bolus Normal Saline --  --   Bolus Amount (mL) 250 mL --  --   Dialysate Change (3k 2.5ca) --  --   Intra-Hemodialysis Comments 250cc prime, initiated via R Chest HD cath non emergently. no heparin. phos sent 252. no complaints. pt resting quietly 1098. tolerating well     02/11/16 2127  During Hemodialysis Assessment  Blood Flow Rate (mL/min) 300 mL/min  Arterial Pressure (mmHg) -110 mmHg  Venous Pressure (mmHg) 90 mmHg  Transmembrane Pressure (mmHg) 70 mmHg  Ultrafiltration Rate (mL/min) 1000 mL/min  Dialysate Flow Rate (mL/min) 600 ml/min  Conductivity: Machine  14.1  HD Safety Checks Performed Yes  Dialysis Fluid Bolus --   Bolus Amount (mL) --   Dialysate Change --   Intra-Hemodialysis Comments 1404. no change

## 2016-02-12 NOTE — Progress Notes (Signed)
Subjective:  Patient still has some mild shortness of breath. Placement and outpatient [facility] pending. We have planned for another session of hemodialysis today.  Objective:  Vital signs in last 24 hours:  Temp:  [98.4 F (36.9 C)-98.9 F (37.2 C)] 98.8 F (37.1 C) (12/19 1503) Pulse Rate:  [82-95] 84 (12/19 1529) Resp:  [15-24] 24 (12/19 1529) BP: (118-157)/(57-93) 149/79 (12/19 1529) SpO2:  [90 %-98 %] 94 % (12/19 1529) Weight:  [54 kg (119 lb 1.6 oz)-56 kg (123 lb 7.3 oz)] 54 kg (119 lb 1.6 oz) (12/19 1503)  Weight change: 0.3 kg (10.6 oz) Filed Weights   02/11/16 2202 02/12/16 0437 02/12/16 1503  Weight: 54.6 kg (120 lb 5.9 oz) 55 kg (121 lb 3.2 oz) 54 kg (119 lb 1.6 oz)    Intake/Output:    Intake/Output Summary (Last 24 hours) at 02/12/16 1547 Last data filed at 02/12/16 1449  Gross per 24 hour  Intake              243 ml  Output             1509 ml  Net            -1266 ml     Physical Exam: General: NAD., laying in bed  HEENT anicteric  Neck supple  Pulm/lungs basilar rales, normal effort  CVS/Heart Regular, no obvious rub  Abdomen:  Soft, NTND, BS present  Extremities: trace edema  Neurologic: Alert, oriented x 3, follows commands  Skin: No acute rashes  Access: Rt IJ PC       Basic Metabolic Panel:   Recent Labs Lab 02/08/16 1337 02/08/16 1935 02/09/16 0600  NA 119*  --  131*  K 4.0  --  3.9  CL 86*  --  94*  CO2 18*  --  23  GLUCOSE 124*  --  109*  BUN 88*  --  64*  CREATININE 9.12*  --  7.11*  CALCIUM 7.4*  --  8.0*  PHOS  --  6.8*  --      CBC:  Recent Labs Lab 02/08/16 1337 02/09/16 0600  WBC 13.4* 10.9*  NEUTROABS 11.3*  --   HGB 8.9* 8.8*  HCT 26.7* 25.4*  MCV 83.7 83.7  PLT 303 305      Microbiology:  No results found for this or any previous visit (from the past 720 hour(s)).  Coagulation Studies: No results for input(s): LABPROT, INR in the last 72 hours.  Urinalysis: No results for input(s):  COLORURINE, LABSPEC, PHURINE, GLUCOSEU, HGBUR, BILIRUBINUR, KETONESUR, PROTEINUR, UROBILINOGEN, NITRITE, LEUKOCYTESUR in the last 72 hours.  Invalid input(s): APPERANCEUR    Imaging: No results found.   Medications:    . anticoagulant sodium citrate  5 mL Intravenous Daily  . carvedilol  25 mg Oral Q12H  . feeding supplement (NEPRO CARB STEADY)  237 mL Oral BID BM  . ferrous sulfate  325 mg Oral BID  . losartan  50 mg Oral QHS  . mycophenolate  500 mg Oral BID  . pantoprazole  40 mg Oral Daily  . pravastatin  40 mg Oral QHS  . sodium chloride flush  3 mL Intravenous Q12H  . tacrolimus  0.5 mg Oral BID   acetaminophen, HYDROcodone-acetaminophen  Assessment/ Plan:  65 y.o. male with  medical problems of Chronic kidney disease stage V followed at [facility], heart transplant at [facility] in February 2010, hypertension, HIT  who was admitted to [facility] on 02/08/2016 for evaluation of shortness  of breath and lower extremity edema.  1. End-stage renal disease, secondary to HTN, atherosclerosis and CNI use, 1st dialysis 02/08/16 2. Severe hyponatremia 3. Volume overload 4. Pulmonary edema 5. Anemia of CKD 6. H/o heart transplant Feb 2010 7. HTN  Plan: Patient has tolerated initiation of dialysis quite well.  h has significantly improved from admission but he still has some mild shortness of breath.  Therefore we plan another dialysis session today.  Outpatient dialysis placement is pending at [facility].  The family has requested that they be placed on a Monday, Wednesday, Friday schedule.  This was discussed with care management today.  The patient also has an appointment at [facility] tomorrow.  Unclear if he will be able to make this appointment.    LOS: 4 Christino Mcglinchey 12/19/20173:47 PM

## 2016-02-12 NOTE — Progress Notes (Signed)
Post HD assessment, unchanged.

## 2016-02-12 NOTE — Progress Notes (Signed)
Third HD treatment continued 12.18.2017

## 2016-02-12 NOTE — Progress Notes (Signed)
Hemodialysis completed without issue. 2.5L goal met. Vitals remained stable throughout. sats currently 98% on 4L 02. Patient has no complaints. Report called to [employer].

## 2016-02-12 NOTE — Care Management (Signed)
Sent email and received confirmation of receipt and left voicemail message for patient Ralph Allison of Dialysis schedule preference: [facility] second shift.  Was also able to verbally confirm this preference with Patient Ralph Allison

## 2016-02-12 NOTE — Progress Notes (Signed)
Pre dialysis assessment 

## 2016-02-12 NOTE — Progress Notes (Signed)
[place] at [place] NAME: Ralph Allison    MR#:  [id number]  DATE OF BIRTH:  [birth year]  SUBJECTIVE:   Patient here due to leg swelling and shortness of breath due to pulmonary edema. Improved w/ hemodialysis but remains hypoxic. Wife at bedside.   REVIEW OF SYSTEMS:    Review of Systems  Constitutional: Negative for chills and fever.  HENT: Negative for congestion and tinnitus.   Eyes: Negative for blurred vision and double vision.  Respiratory: Positive for shortness of breath. Negative for cough and wheezing.   Cardiovascular: Positive for leg swelling. Negative for chest pain, orthopnea and PND.  Gastrointestinal: Negative for abdominal pain, diarrhea, nausea and vomiting.  Genitourinary: Negative for dysuria and hematuria.  Neurological: Negative for dizziness, sensory change and focal weakness.  All other systems reviewed and are negative.   Nutrition: Cardiac, Renal diet Tolerating Diet: Yes Tolerating PT: Ambulatory   DRUG ALLERGIES:   Allergies  Allergen Reactions  . Heparin     Heart transplant    VITALS:  Blood pressure 139/82, pulse 86, temperature 98.8 F (37.1 C), temperature source Oral, resp. rate (!) 23, height $RemoveBe'5\' 2"'dtjjCYsTc$  (1.575 m), weight 54 kg (119 lb 1.6 oz), SpO2 (!) 89 %.  PHYSICAL EXAMINATION:   Physical Exam  GENERAL:  65 y.o.-year-old patient lying in the bed in NAD.    EYES: Pupils equal, round, reactive to light and accommodation. No scleral icterus. Extraocular muscles intact.  HEENT: Head atraumatic, normocephalic. Oropharynx and nasopharynx clear.  NECK:  Supple, no jugular venous distention. No thyroid enlargement, no tenderness.  LUNGS: Good A/E b/l, no wheezing, no rhonchi, bibasilar rales. No use of accessory muscles of respiration.  CARDIOVASCULAR: S1, S2 normal. No murmurs, rubs, or gallops.  ABDOMEN: Soft, nontender, nondistended. Bowel sounds present. No organomegaly or mass.  EXTREMITIES:  No cyanosis, clubbing or mild edema b/l NEUROLOGIC: Cranial nerves II through XII are intact. No focal Motor or sensory deficits b/l.   PSYCHIATRIC: The patient is alert and oriented x 3.  SKIN: No obvious rash, lesion, or ulcer.   Right chest wall  Perm cath in place for HD.     LABORATORY PANEL:   CBC  Recent Labs Lab 02/09/16 0600  WBC 10.9*  HGB 8.8*  HCT 25.4*  PLT 305   ------------------------------------------------------------------------------------------------------------------  Chemistries   Recent Labs Lab 02/09/16 0600  NA 131*  K 3.9  CL 94*  CO2 23  GLUCOSE 109*  BUN 64*  CREATININE 7.11*  CALCIUM 8.0*   ------------------------------------------------------------------------------------------------------------------  Cardiac Enzymes  Recent Labs Lab 02/08/16 1337  TROPONINI <0.03   ------------------------------------------------------------------------------------------------------------------  RADIOLOGY:  No results found.   ASSESSMENT AND PLAN:   65 year old male with past medical history of cardiomyopathy status post heart transplant, diabetes, chronic kidney disease who presented to the hospital due to worsening lower extremity edema and shortness of breath.  1. Acute respiratory failure with hypoxia-secondary to volume overload and pulmonary edema. -Continue O2 supplementation. Cont. HD as per Nephrology and improving.   2. Acute on chronic renal failure-patient has progressed now to ESRD as per nephrology. Charolette Forward catheter has been placed and patient has been initiated on hemodialysis. Cont. HD as per Nephro and pt. Is improving.  Pt. Has appointment with Nephro at [facility] tomorrow but unclear if he can make that appointment. - Patient is actually on a transplant list at [facility] to have a kidney transplant in the near future.  - nephrology working on outpatient  spot for HD.  Pt's family wants a MWF schedule  3. History of cardiomyopathy  and heart transplant-continue CellCept, Prograf.  4. Essential hypertension-continue losartan, Coreg.   All the records are reviewed and case discussed with Care Management/Social Worker. Management plans discussed with the patient, family and they are in agreement.  CODE STATUS: Full  DVT Prophylaxis: Teds and SCDs  TOTAL TIME TAKING CARE OF THIS PATIENT: 30 minutes.   POSSIBLE D/C IN 1-2 DAYS, DEPENDING ON CLINICAL CONDITION.   Henreitta Leber M.D on 02/12/2016 at 4:17 PM  Between 7am to 6pm - Pager - [phone]  After 6pm go to [email or web address] - Proofreader  [employer]  [facility]  Office  [phone]  CC: Primary care physician; Diamond Nickel, MD

## 2016-02-13 ENCOUNTER — Inpatient Hospital Stay (HOSPITAL_COMMUNITY)
Admit: 2016-02-13 | Discharge: 2016-02-13 | Disposition: A | Discharge status: Home / Self Care | Payer: Medicare Other | Attending: Specialist | Admitting: Specialist

## 2016-02-13 ENCOUNTER — Inpatient Hospital Stay: Payer: Medicare Other

## 2016-02-13 DIAGNOSIS — I509 Heart failure, unspecified: Secondary | ICD-10-CM

## 2016-02-13 LAB — RENAL FUNCTION PANEL
Albumin: 2.1 g/dL — ABNORMAL LOW (ref 3.5–5.0)
Anion gap: 7 (ref 5–15)
BUN: 25 mg/dL — AB (ref 6–20)
CHLORIDE: 96 mmol/L — AB (ref 101–111)
CO2: 30 mmol/L (ref 22–32)
CREATININE: 2.93 mg/dL — AB (ref 0.61–1.24)
Calcium: 7.8 mg/dL — ABNORMAL LOW (ref 8.9–10.3)
GFR calc Af Amer: 24 mL/min — ABNORMAL LOW (ref >60)
GFR, EST NON AFRICAN AMERICAN: 21 mL/min — AB (ref >60)
Glucose, Bld: 134 mg/dL — ABNORMAL HIGH (ref 65–99)
Phosphorus: 2.1 mg/dL — ABNORMAL LOW (ref 2.5–4.6)
Potassium: 3.8 mmol/L (ref 3.5–5.1)
Sodium: 133 mmol/L — ABNORMAL LOW (ref 135–145)

## 2016-02-13 LAB — CBC
HCT: 23.2 % — ABNORMAL LOW (ref 40.0–52.0)
Hemoglobin: 7.7 g/dL — ABNORMAL LOW (ref 13.0–18.0)
MCH: 28.6 pg (ref 26.0–34.0)
MCHC: 33.3 g/dL (ref 32.0–36.0)
MCV: 85.7 fL (ref 80.0–100.0)
PLATELETS: 300 10*3/uL (ref 150–440)
RBC: 2.71 MIL/uL — ABNORMAL LOW (ref 4.40–5.90)
RDW: 14.1 % (ref 11.5–14.5)
WBC: 10.3 10*3/uL (ref 3.8–10.6)

## 2016-02-13 LAB — PHOSPHORUS: Phosphorus: 2.1 mg/dL — ABNORMAL LOW (ref 2.5–4.6)

## 2016-02-13 NOTE — Progress Notes (Signed)
Subjective:  Patient completed hemodialysis yesterday. He still has some mild shortness of breath and he has bilateral rales. CT chest without contrast has been ordered.   Objective:  Vital signs in last 24 hours:  Temp:  [98 F (36.7 C)-98.7 F (37.1 C)] 98.4 F (36.9 C) (12/20 1648) Pulse Rate:  [82-93] 89 (12/20 1648) Resp:  [14-29] 16 (12/20 1648) BP: (128-157)/(68-96) 151/68 (12/20 1648) SpO2:  [75 %-100 %] 93 % (12/20 1648) Weight:  [51.6 kg (113 lb 12.1 oz)-58.8 kg (129 lb 11.2 oz)] 58.8 kg (129 lb 11.2 oz) (12/20 1648)  Weight change: -1.977 kg (-4 lb 5.7 oz) Filed Weights   02/13/16 1415 02/13/16 1627 02/13/16 1648  Weight: 54.3 kg (119 lb 11.4 oz) 53.5 kg (117 lb 15.1 oz) 58.8 kg (129 lb 11.2 oz)    Intake/Output:    Intake/Output Summary (Last 24 hours) at 02/13/16 1736 Last data filed at 02/13/16 1627  Gross per 24 hour  Intake              603 ml  Output             3200 ml  Net            -2597 ml     Physical Exam: General: NAD., laying in bed  HEENT anicteric  Neck supple  Pulm/lungs Bilateral rales, normal effort  CVS/Heart Regular, no obvious rub  Abdomen:  Soft, NTND, BS present  Extremities: trace edema  Neurologic: Alert, oriented x 3, follows commands  Skin: No acute rashes  Access: Rt IJ PC       Basic Metabolic Panel:   Recent Labs Lab 02/08/16 1337 02/08/16 1935 02/09/16 0600 02/13/16 0353 02/13/16 1417  NA 119*  --  131*  --  133*  K 4.0  --  3.9  --  3.8  CL 86*  --  94*  --  96*  CO2 18*  --  23  --  30  GLUCOSE 124*  --  109*  --  134*  BUN 88*  --  64*  --  25*  CREATININE 9.12*  --  7.11*  --  2.93*  CALCIUM 7.4*  --  8.0*  --  7.8*  PHOS  --  6.8*  --  2.1* 2.1*     CBC:  Recent Labs Lab 02/08/16 1337 02/09/16 0600 02/13/16 1417  WBC 13.4* 10.9* 10.3  NEUTROABS 11.3*  --   --   HGB 8.9* 8.8* 7.7*  HCT 26.7* 25.4* 23.2*  MCV 83.7 83.7 85.7  PLT 303 305 300      Microbiology:  No results found  for this or any previous visit (from the past 720 hour(s)).  Coagulation Studies: No results for input(s): LABPROT, INR in the last 72 hours.  Urinalysis: No results for input(s): COLORURINE, LABSPEC, PHURINE, GLUCOSEU, HGBUR, BILIRUBINUR, KETONESUR, PROTEINUR, UROBILINOGEN, NITRITE, LEUKOCYTESUR in the last 72 hours.  Invalid input(s): APPERANCEUR    Imaging: No results found.   Medications:    . anticoagulant sodium citrate  5 mL Intravenous Daily  . carvedilol  25 mg Oral Q12H  . feeding supplement (NEPRO CARB STEADY)  237 mL Oral BID BM  . ferrous sulfate  325 mg Oral BID  . losartan  50 mg Oral QHS  . mycophenolate  500 mg Oral BID  . pantoprazole  40 mg Oral Daily  . pravastatin  40 mg Oral QHS  . sodium chloride flush  3 mL Intravenous Q12H  .  tacrolimus  0.5 mg Oral BID   acetaminophen, HYDROcodone-acetaminophen  Assessment/ Plan:  65 y.o. male with  medical problems of Chronic kidney disease stage V followed at [facility], heart transplant at [facility] in February 2010, hypertension, HIT  who was admitted to [facility] on 02/08/2016 for evaluation of shortness of breath and lower extremity edema.  1. End-stage renal disease, secondary to HTN, atherosclerosis and CNI use, 1st dialysis 02/08/16 2. Severe hyponatremia 3. Volume overload 4. Pulmonary edema 5. Anemia of CKD 6. H/o heart transplant Feb 2010 7. HTN  Plan: Patient completed another session of hemodialysis today. His azotemia and renal parameters have significantly improved. However he continues to have bilateral rales. I've discussed the case with Dr. Verdell Carmine.  We have agreed to obtain CT scan of the chest without contrast for further evaluation. In addition he may need further pulmonary evaluation. In addition the patient has had vein mapping performed at [facility]. At some point he will need dialysis access to be placed but this can be performed as an outpatient. He has been accepted to the  dialysis unit on [address] for TTS schedule. Patient and his family were informed regarding this. We will follow-up with the patient tomorrow.   LOS: 5 Jonte Shiller 12/20/20175:36 PM

## 2016-02-13 NOTE — Progress Notes (Signed)
Pre hd info 

## 2016-02-13 NOTE — Progress Notes (Signed)
Post hd assessment 

## 2016-02-13 NOTE — Progress Notes (Signed)
Post hd vitals 

## 2016-02-13 NOTE — Progress Notes (Signed)
  End of hd 

## 2016-02-13 NOTE — Care Management (Addendum)
Was contacted by Laruth Bouchard from [facility] intake to confirm referral information that has been sent to [facility].  She relays that [facility] does not have MWF availability at present and neither does the [facility].  The clinic in [place] may have those days available. The second shift time slot does appear available however. It continues to appear that patient will require oxygen at discharge.  His respiratory diagnosis is documented as acute and this may have affect on whether it will be covered by medicare. Reaching out to [facility]

## 2016-02-13 NOTE — Progress Notes (Signed)
Pre hd assessment  

## 2016-02-13 NOTE — Progress Notes (Signed)
[place] at [place] NAME: Ralph Allison    MR#:  [id number]  DATE OF BIRTH:  05-27-1950  SUBJECTIVE:   remains short of breath and hypoxic. Wife is at bedside. Going for hemodialysis later today.  REVIEW OF SYSTEMS:    Review of Systems  Constitutional: Negative for chills and fever.  HENT: Negative for congestion and tinnitus.   Eyes: Negative for blurred vision and double vision.  Respiratory: Positive for shortness of breath. Negative for cough and wheezing.   Cardiovascular: Negative for chest pain, orthopnea, leg swelling and PND.  Gastrointestinal: Negative for abdominal pain, diarrhea, nausea and vomiting.  Genitourinary: Negative for dysuria and hematuria.  Neurological: Negative for dizziness, sensory change and focal weakness.  All other systems reviewed and are negative.   Nutrition: Cardiac, Renal diet Tolerating Diet: Yes Tolerating PT: Ambulatory   DRUG ALLERGIES:   Allergies  Allergen Reactions  . Heparin     Heart transplant    VITALS:  Blood pressure 132/84, pulse 87, temperature 98.4 F (36.9 C), temperature source Oral, resp. rate (!) 29, height $RemoveBe'5\' 2"'ddxrtcdMk$  (1.575 m), weight 54.3 kg (119 lb 11.4 oz), SpO2 93 %.  PHYSICAL EXAMINATION:   Physical Exam  GENERAL:  65 y.o.-year-old patient lying in the bed in NAD.    EYES: Pupils equal, round, reactive to light and accommodation. No scleral icterus. Extraocular muscles intact.  HEENT: Head atraumatic, normocephalic. Oropharynx and nasopharynx clear.  NECK:  Supple, no jugular venous distention. No thyroid enlargement, no tenderness.  LUNGS: Good A/E b/l, no wheezing, no rhonchi, diffuse crackles mid way up lung fields. No use of accessory muscles of respiration.  CARDIOVASCULAR: S1, S2 normal. No murmurs, rubs, or gallops.  ABDOMEN: Soft, nontender, nondistended. Bowel sounds present. No organomegaly or mass.  EXTREMITIES: No cyanosis, clubbing or mild edema  b/l NEUROLOGIC: Cranial nerves II through XII are intact. No focal Motor or sensory deficits b/l.   PSYCHIATRIC: The patient is alert and oriented x 3.  SKIN: No obvious rash, lesion, or ulcer.   Right chest wall  Perm cath in place for HD.     LABORATORY PANEL:   CBC  Recent Labs Lab 02/13/16 1417  WBC 10.3  HGB 7.7*  HCT 23.2*  PLT 300   ------------------------------------------------------------------------------------------------------------------  Chemistries   Recent Labs Lab 02/09/16 0600  NA 131*  K 3.9  CL 94*  CO2 23  GLUCOSE 109*  BUN 64*  CREATININE 7.11*  CALCIUM 8.0*   ------------------------------------------------------------------------------------------------------------------  Cardiac Enzymes  Recent Labs Lab 02/08/16 1337  TROPONINI <0.03   ------------------------------------------------------------------------------------------------------------------  RADIOLOGY:  No results found.   ASSESSMENT AND PLAN:   65 year old male with past medical history of cardiomyopathy status post heart transplant, diabetes, chronic kidney disease who presented to the hospital due to worsening lower extremity edema and shortness of breath.  1. Acute respiratory failure with hypoxia-secondary to volume overload and pulmonary edema. -Continue O2 supplementation. Cont. HD as per Nephrology and pt. Has had HD for multiple days but not improving. Remains hypoxic and on 4-5 L [place].  - will get CT chest non-contrast today and also Echo. Will get Pulmonary consult. Discussed w/ Dr. Mortimer Fries.    2. Acute on chronic renal failure-patient has progressed now to ESRD as per nephrology. Charolette Forward catheter has been placed and patient has been initiated on hemodialysis. Cont. HD as per Nephro.   - Patient is actually on a transplant list at [facility] to have a kidney transplant  in the near future.  - nephrology working on outpatient spot for HD.  Pt. To have HD again today and also  tomorrow.   3. History of cardiomyopathy and heart transplant-continue CellCept, Prograf.  4. Essential hypertension-continue losartan, Coreg.  Discussed plan of care with family at bedside and over the phone.   All the records are reviewed and case discussed with Care Management/Social Worker. Management plans discussed with the patient, family and they are in agreement.  CODE STATUS: Full  DVT Prophylaxis: Teds and SCDs  TOTAL TIME TAKING CARE OF THIS PATIENT: 35 minutes.   POSSIBLE D/C IN 1-2 DAYS, DEPENDING ON CLINICAL CONDITION.   Henreitta Leber M.D on 02/13/2016 at 3:50 PM  Between 7am to 6pm - Pager - [phone]  After 6pm go to [email or web address] - Proofreader  [employer] [facility] [place] [facility]  Office  [phone]  CC: Primary care physician; Diamond Nickel, MD

## 2016-02-13 NOTE — Progress Notes (Signed)
SATURATION QUALIFICATIONS: (This note is used to comply with regulatory documentation for home oxygen)  Patient Saturations on Room Air at Rest = 75%  Patient Saturations on Room Air while Ambulating = N/A  Patient Saturations on Liters of oxygen while Ambulating =  Please briefly explain why patient needs home oxygen:

## 2016-02-13 NOTE — Progress Notes (Signed)
Hd start 

## 2016-02-13 NOTE — Care Management (Signed)
Spoke with attending, patient and his wife.  Accepted the T TH Sat at 11:20a.  Patient and wife will present Saturday 10:45 (rather than on Friday to perform paper work then Saturday for treatment) to complete paper work then receive treatment. Discussed medicare guidelines for 02 coverage in regards to diagnosis.  Attending states that patient most likely has acute on chronic diastolic heart failure and will obtain an echo evaluate.  Provided patient with the Welcome letter.

## 2016-02-14 DIAGNOSIS — N170 Acute kidney failure with tubular necrosis: Secondary | ICD-10-CM

## 2016-02-14 DIAGNOSIS — J810 Acute pulmonary edema: Secondary | ICD-10-CM

## 2016-02-14 LAB — ECHOCARDIOGRAM COMPLETE
Height: 62 in
WEIGHTICAEL: 2075.2 [oz_av]

## 2016-02-14 MED ORDER — GUAIFENESIN 100 MG/5ML PO SOLN: 5.0000 mL | ORAL | Status: DC | PRN

## 2016-02-14 MED ORDER — BOOST PLUS PO LIQD: 237.0000 mL | Freq: Three times a day (TID) | ORAL | Status: DC

## 2016-02-14 NOTE — Progress Notes (Signed)
Pt. Here due shortness of breath due to Pulm. Edema.   Pt.'s Echo showing Pulm. HTN with likely diastolic CHF (Acute on Chronic).  Pt. Will need Home O2 upon discharge.

## 2016-02-14 NOTE — Care Management (Signed)
Spoke with Debbie at [employer] that patient and his wife are agreeable with T TH S schedule and would like to come in 30 mins early on 12/23 for paperwork and treatment instead of going Friday just for paper work.  Patient had chest CT 12.20 which shows congestive heart failure. Echo results are pending.  Pulmonary consult pending. Updated [facility] of diagnosis. Provided patient/wife with Welcome letter and dialysis appointment times.

## 2016-02-14 NOTE — Progress Notes (Signed)
IV and tele removed from patient. Discharge instructions given to patient and wife. Verbalized understanding. VSS. No acute distress at this time. Wife at bedside and will be transporting patient home.

## 2016-02-14 NOTE — Progress Notes (Signed)
Post dialysis 

## 2016-02-14 NOTE — Progress Notes (Signed)
Dialysis started 

## 2016-02-14 NOTE — Progress Notes (Signed)
Pre Dialysis 

## 2016-02-14 NOTE — H&P (Signed)
[facility]   PATIENT NAME: Ralph Allison    MR#:  [id number]  DATE OF BIRTH:  Dec 18, 1950  DATE OF ADMISSION:  02/08/2016 DATE OF CONSULT:02/14/2016 PRIMARY CARE PHYSICIAN: Aretha Parrot, MD   REQUESTING/REFERRING PHYSICIAN: Dr. Cherlynn Kaiser  CHIEF COMPLAINT:   Chief Complaint  Patient presents with  . Shortness of Breath  . Leg Swelling    HISTORY OF PRESENT ILLNESS: Ralph Allison  is a 65 y.o. male with a known history of Diabetes, heart transplant 2008, chronic renal failure- follows at [facility] cardiology for his transplant related issues.  -Recently started having worsening in his kidney function and pulmonary edema and was admitted 2 weeks ago at [facility] where he was discharged home and given appointment with nephrology clinic and vascular clinic for possible AV fistula placements. -patient has been on long term Immunosuppressive therapy He has no fevers, no cough, no night sweats, no chills. Only Complaint is SOB and DOE   -He continued to feel short of breath and gaining weight he was sent to ER and for evaluation HD - ER physician spoke to them and gave him option to get transferred to [facility] emergency room as his all the care is fair, but they chose to stay here as they were not very sure if [facility] will initiate the dialysis   HOSPITAL COURSE  nephrology Consulted and started HD daily treatments x 5 Patient with extensive B/L Infiltrates on CXR which does show improvement of infiltrates CT chest confirms b/l infiltrates and effusions-patient does feel better since admission Has been on high amounts of oxygen, but not on oxygen today at time of examination.   PAST MEDICAL HISTORY:   Past Medical History:  Diagnosis Date  . Diabetes mellitus without complication (HCC)   . Heart transplant recipient [facility]) 2009  . Renal disorder     PAST SURGICAL HISTORY:  Past Surgical History:  Procedure Laterality Date  . HEART TRANSPLANT    . PERIPHERAL VASCULAR  CATHETERIZATION N/A 02/08/2016   Procedure: Dialysis/Perma Catheter Insertion;  Surgeon: Renford Dills, MD;  Location: [facility] INVASIVE CV LAB;  Service: Cardiovascular;  Laterality: N/A;    SOCIAL HISTORY:  Social History  Substance Use Topics  . Smoking status: Never Smoker  . Smokeless tobacco: Never Used  . Alcohol use No    FAMILY HISTORY:  Family History  Problem Relation Age of Onset  . Hypertension Father     DRUG ALLERGIES:  Allergies  Allergen Reactions  . Heparin     Heart transplant    REVIEW OF SYSTEMS:   CONSTITUTIONAL: No fever,Positive fatigue or weakness.  EYES: No blurred or double vision.  EARS, NOSE, AND THROAT: No tinnitus or ear pain.  RESPIRATORY: No cough, positive shortness of breath, no wheezing or hemoptysis.  CARDIOVASCULAR: No chest pain, orthopnea,- edema.  GASTROINTESTINAL: No nausea, vomiting, diarrhea or abdominal pain.  GENITOURINARY: No dysuria, hematuria.  ENDOCRINE: No polyuria, nocturia,  HEMATOLOGY: No anemia, easy bruising or bleeding SKIN: No rash or lesion. MUSCULOSKELETAL: No joint pain or arthritis.   NEUROLOGIC: No tingling, numbness, weakness.  PSYCHIATRY: No anxiety or depression.   MEDICATIONS AT HOME:  Prior to Admission medications   Medication Sig Start Date End Date Taking? Authorizing Provider  amLODipine (NORVASC) 10 MG tablet Take 1 tablet by mouth daily. 01/25/16 01/24/17 Yes Historical Provider, MD  carvedilol (COREG) 25 MG tablet Take 1 tablet by mouth every 12 (twelve) hours. 09/09/15 09/08/16 Yes Historical Provider, MD  ferrous sulfate 325 (65 FE)  MG EC tablet Take 1 tablet by mouth 2 (two) times daily. 01/30/16 01/29/17 Yes Historical Provider, MD  furosemide (LASIX) 40 MG tablet Take 1 tablet by mouth 2 (two) times daily. 01/30/16 01/29/17 Yes Historical Provider, MD  mycophenolate (CELLCEPT) 500 MG tablet Take 1 tablet by mouth 2 (two) times daily. 12/19/15  Yes Historical Provider, MD  pantoprazole (PROTONIX)  40 MG tablet Take 1 tablet by mouth daily. 04/04/15  Yes Historical Provider, MD  pravastatin (PRAVACHOL) 40 MG tablet Take 1 tablet by mouth at bedtime.    Historical Provider, MD  tacrolimus (PROGRAF) 1 MG capsule Take 1 capsule by mouth 3 (three) times daily. 12/17/15   Historical Provider, MD      PHYSICAL EXAMINATION:   VITAL SIGNS: Blood pressure 137/75, pulse 84, temperature 99.1 F (37.3 C), temperature source Oral, resp. rate 18, height $RemoveBe'5\' 2"'qneumJVnU$  (1.575 m), weight 116 lb 1.6 oz (52.7 kg), SpO2 91 %.  GENERAL:  65 y.o.-year-old patient sitting up in chair no acute distress.  EYES: Pupils equal, round, reactive to light and accommodation. No scleral icterus. Extraocular muscles intact.  HEENT: Head atraumatic, normocephalic. Oropharynx and nasopharynx clear.  NECK:  Supple, no jugular venous distention. No thyroid enlargement, no tenderness.  LUNGS: Normal breath sounds bilaterally, no wheezing, Bilateral crepitation. No use of accessory muscles of respiration.  +crackles CARDIOVASCULAR: S1, S2 normal. No murmurs, rubs, or gallops. Surgical scar present ABDOMEN: Soft, nontender, nondistended. Bowel sounds present. No organomegaly or mass.  EXTREMITIES: Bilateral pedal edema, no cyanosis, or clubbing.  NEUROLOGIC: Cranial nerves II through XII are intact. Muscle strength 5/5 in all extremities. Sensation intact. Gait not checked.  PSYCHIATRIC: The patient is alert and oriented x 3.  SKIN: No obvious rash, lesion, or ulcer.   LABORATORY PANEL:   CBC  Recent Labs Lab 02/08/16 1337 02/09/16 0600 02/13/16 1417  WBC 13.4* 10.9* 10.3  HGB 8.9* 8.8* 7.7*  HCT 26.7* 25.4* 23.2*  PLT 303 305 300  MCV 83.7 83.7 85.7  MCH 27.9 28.9 28.6  MCHC 33.4 34.5 33.3  RDW 13.7 13.8 14.1  LYMPHSABS 0.6*  --   --   MONOABS 1.2*  --   --   EOSABS 0.1  --   --   BASOSABS 0.1  --   --     ------------------------------------------------------------------------------------------------------------------  Chemistries   Recent Labs Lab 02/08/16 1337 02/09/16 0600 02/13/16 1417  NA 119* 131* 133*  K 4.0 3.9 3.8  CL 86* 94* 96*  CO2 18* 23 30  GLUCOSE 124* 109* 134*  BUN 88* 64* 25*  CREATININE 9.12* 7.11* 2.93*  CALCIUM 7.4* 8.0* 7.8*   ------------------------------------------------------------------------------------------------------------------ estimated creatinine clearance is 18.7 mL/min (by C-G formula based on SCr of 2.93 mg/dL (H)). ------------------------------------------------------------------------------------------------------------------ No results for input(s): TSH, T4TOTAL, T3FREE, THYROIDAB in the last 72 hours.  Invalid input(s): FREET3   Coagulation profile No results for input(s): INR, PROTIME in the last 168 hours. ------------------------------------------------------------------------------------------------------------------- No results for input(s): DDIMER in the last 72 hours. -------------------------------------------------------------------------------------------------------------------  Cardiac Enzymes  Recent Labs Lab 02/08/16 1337  TROPONINI <0.03   ------------------------------------------------------------------------------------------------------------------ Invalid input(s): POCBNP  ---------------------------------------------------------------------------------------------------------------  Urinalysis No results found for: COLORURINE, APPEARANCEUR, LABSPEC, PHURINE, GLUCOSEU, HGBUR, BILIRUBINUR, KETONESUR, PROTEINUR, UROBILINOGEN, NITRITE, LEUKOCYTESUR   RADIOLOGY: Ct Chest Wo Contrast  Result Date: 02/13/2016 CLINICAL DATA:  Shortness of breath and hypoxia. Bilateral gallstone. Heart transplant patient. Chronic kidney disease. EXAM: CT CHEST WITHOUT CONTRAST TECHNIQUE: Multidetector CT imaging of the  chest was performed following the standard protocol without  IV contrast. COMPARISON:  Chest radiograph on 02/10/2016 FINDINGS: Cardiovascular: Heart size is at the upper limits of normal. Aortic atherosclerosis. Right jugular central venous dialysis catheter with tip in the right atrium. Mediastinum/Nodes: No definite lymphadenopathy identified on this unenhanced exam. A 2.3 cm low-attenuation left thyroid lobe nodule is seen. Lungs/Pleura: Small multilocular bilateral pleural effusions are seen, with extension into the right major fissure. Diffuse bilateral airspace disease is seen likely due to pulmonary edema. No definite pulmonary mass identified. Upper Abdomen:  Diffuse body wall edema. Musculoskeletal:  No suspicious bone lesions. IMPRESSION: Diffuse bilateral airspace disease, small multilocular bilateral pleural effusions, and diffuse body wall edema, likely due to congestive heart failure. 2.3 cm low-attenuation left thyroid lobe nodule. Thyroid ultrasound recommended for further evaluation . This follows ACR consensus guidelines: Managing Incidental Thyroid Nodules Detected on Imaging: White Paper of the ACR Incidental Thyroid Findings Committee. J Am Coll Radiol 2015; 12:143-150. Aortic atherosclerosis. Electronically Signed   By: Earle Gell M.D.   On: 02/13/2016 19:49    EKG: Orders placed or performed during the hospital encounter of 02/08/16  . EKG 12-Lead  . EKG 12-Lead    IMPRESSION AND PLAN: 64 yo pleasant [place] male with hypoxic resp distress with b/l infiltrates likely from acute nephrogenic pulmonary edema in setting of heart transplant. Findings c/w cardio-renal syndrome-echo pending   1. Acute on chronic renal failure   S/p  hemodialysis.   2.  Pulmonary edema/interstitial lun disease   Acute hypoxic respiratory failure  Oxygen as needed  After discussion with patient, he does seem to hav improved with Aggressive HD therapy Infection is less likely at this time,  inflammatory process is less likely at this time -and also patient REFUSES systemic steroid therapy at this time  2.history of heart transplant   Continue his transplant medication as he was taking at home. -follow up at [facility]  No further recs at this time. No indication for BRonch at this time   I have personally obtained a history, examined the patient, evaluated Pertinent laboratory and RadioGraphic/imaging results, and  formulated the assessment and plan   The Patient requires high complexity decision making for assessment and support, frequent evaluation and titration of therapies.  Patient satisfied with Plan of action and management. All questions answered  Corrin Parker, M.D.  [facility]  Medical Director [place] Director [facility] Cardio-Pulmonary Department

## 2016-02-14 NOTE — Care Management (Signed)
Patient will discharge home today with home 02 and home health nursing through [facility].  Patient and his wife verbalize understanding of first dialysis appt .  Notified Alda Lea of discharge. Informed Malachy Mood with [facility]  the discharge summary is not available to send at present time

## 2016-02-14 NOTE — Care Management (Signed)
Spoke with attending and there is an underlying chronic competent causing hypoxia.  Updated [facility]. It is verbally reported that will anticipate discharge today.

## 2016-02-14 NOTE — Discharge Summary (Signed)
[facility] [place] NAME: Ralph Allison    MR#:  [id number]  DATE OF BIRTH:  [birth year]  DATE OF ADMISSION:  02/08/2016 ADMITTING PHYSICIAN: Vaughan Basta, MD  DATE OF DISCHARGE: 02/14/2016  3:59 PM  PRIMARY CARE PHYSICIAN: MASOOD, Georgianne Fick, MD    ADMISSION DIAGNOSIS:  Shortness of breath [R06.02] Peripheral edema [R60.9] Acute renal failure superimposed on chronic kidney disease, unspecified CKD stage, unspecified acute renal failure type ([place]) [N17.9, N18.9]  DISCHARGE DIAGNOSIS:  Principal Problem:   Acute renal failure (ARF) ([place])   SECONDARY DIAGNOSIS:   Past Medical History:  Diagnosis Date  . Diabetes mellitus without complication ([place])   . Heart transplant recipient [facility]) 2009  . Renal disorder     HOSPITAL COURSE:   65 year old male with past medical history of cardiomyopathy status post heart transplant, diabetes, chronic kidney disease who presented to the hospital due to worsening lower extremity edema and shortness of breath.  1. Acute respiratory failure with hypoxia-secondary to volume overload and pulmonary edema From acute on chronic diastolic CHF and also underlying pulmonary hypertension. -Patient had worsening renal failure and therefore was started on hemodialysis. He got multiple dialysis sessions while in the hospital and his volume status has improved. He continues to be hypoxic and is being discharged on home oxygen given his chronic diastolic CHF and underlying pulmonary hypertension. -Pulmonary was also consulted as he continued to be hypoxic despite having fluid removed and he recommended possibly starting him on some steroids but he did not want to do that presently. -he did have an echocardiogram done which showed moderate pulmonary hypertension. -His O2 can be weaned off as an outpatient by his primary care physician.  2. Acute on chronic renal failure-he had progressed ESRD given his volume  overload. -Perm catheter has been placed and patient has been initiated on hemodialysis. Patient received multiple hemodialysis sessions while in the hospital and tolerated it well. His volume status has improved.  -he has been arranged for an outpatient dialysis spot on Tuesday Thursday Saturday. He will continue follow-up with nephrology with Dr. Holley Raring as an outpatient. - Patient is actually on a transplant list at [facility] to have a kidney transplant in the near future and will cont. Follow up at [facility].    3. History of cardiomyopathy and heart transplant-he will continue CellCept, Prograf.  4. Essential hypertension-he will continue losartan, Coreg.  DISCHARGE CONDITIONS:   Stable.   CONSULTS OBTAINED:  Treatment Team:  Algernon Huxley, MD Flora Lipps, MD  DRUG ALLERGIES:   Allergies  Allergen Reactions  . Heparin     Heart transplant    DISCHARGE MEDICATIONS:   Allergies as of 02/14/2016      Reactions   Heparin    Heart transplant      Medication List    STOP taking these medications   furosemide 40 MG tablet Commonly known as:  LASIX   sodium bicarbonate 650 MG tablet     TAKE these medications   amLODipine 10 MG tablet Commonly known as:  NORVASC Take 1 tablet by mouth daily.   aspirin EC 81 MG tablet Take 1 tablet by mouth every 3 (three) days.   b complex vitamins tablet Take 1 tablet by mouth daily.   carvedilol 25 MG tablet Commonly known as:  COREG Take 1 tablet by mouth every 12 (twelve) hours.   ferrous sulfate 325 (65 FE) MG EC tablet Take 1 tablet by mouth 2 (two)  times daily.   mycophenolate 500 MG tablet Commonly known as:  CELLCEPT Take 1 tablet by mouth 2 (two) times daily.   pantoprazole 40 MG tablet Commonly known as:  PROTONIX Take 1 tablet by mouth daily.   pravastatin 40 MG tablet Commonly known as:  PRAVACHOL Take 1 tablet by mouth at bedtime.   tacrolimus 0.5 MG capsule Commonly known as:  PROGRAF Take 0.5 mg by mouth 2  (two) times daily.            Durable Medical Equipment        Start     Ordered   02/14/16 1438  For home use only DME standard manual wheelchair with seat cushion  Once    Comments:  Patient suffers from Pulmonary HTN which impairs their ability to perform daily activities like bathing in the home.  A walker will not resolve  issue with performing activities of daily living. A wheelchair will allow patient to safely perform daily activities. Patient can safely propel the wheelchair in the home or has a caregiver who can provide assistance.  Accessories: elevating leg rests (ELRs), wheel locks, extensions and anti-tippers.   02/14/16 1438   02/14/16 1342  For home use only DME oxygen  Once    Question Answer Comment  Mode or (Route) Nasal cannula   Liters per Minute 2   Frequency Continuous (stationary and portable oxygen unit needed)   Oxygen conserving device Yes   Oxygen delivery system Gas      02/14/16 1341        DISCHARGE INSTRUCTIONS:   DIET:  Cardiac diet Renal low salt  DISCHARGE CONDITION:  Stable  ACTIVITY:  Activity as tolerated  OXYGEN:  Home Oxygen: Yes.     Oxygen Delivery: 2-4 liters/min via Patient connected to nasal cannula oxygen  DISCHARGE LOCATION:  Home with [place]   If you experience worsening of your admission symptoms, develop shortness of breath, life threatening emergency, suicidal or homicidal thoughts you must seek medical attention immediately by calling 911 or calling your MD immediately  if symptoms less severe.  You Must read complete instructions/literature along with all the possible adverse reactions/side effects for all the Medicines you take and that have been prescribed to you. Take any new Medicines after you have completely understood and accpet all the possible adverse reactions/side effects.   Please note  You were cared for by a hospitalist during your hospital stay. If you have any questions about your  discharge medications or the care you received while you were in the hospital after you are discharged, you can call the unit and asked to speak with the hospitalist on call if the hospitalist that took care of you is not available. Once you are discharged, your primary care physician will handle any further medical issues. Please note that NO REFILLS for any discharge medications will be authorized once you are discharged, as it is imperative that you return to your primary care physician (or establish a relationship with a primary care physician if you do not have one) for your aftercare needs so that they can reassess your need for medications and monitor your lab values.     Today   Still has some shortness of breath but improved since admission.   VITAL SIGNS:  Blood pressure 134/74, pulse 86, temperature 99.1 F (37.3 C), temperature source Oral, resp. rate 18, height $RemoveBe'5\' 2"'vMtGlJLrn$  (1.575 m), weight 52.4 kg (115 lb 8.3 oz), SpO2 95 %.  I/O:   Intake/Output Summary (Last 24 hours) at 02/14/16 1605 Last data filed at 02/14/16 1249  Gross per 24 hour  Intake              243 ml  Output             1700 ml  Net            -1457 ml    PHYSICAL EXAMINATION:    GENERAL:  65 y.o.-year-old patient lying in the bed in NAD.    EYES: Pupils equal, round, reactive to light and accommodation. No scleral icterus. Extraocular muscles intact.  HEENT: Head atraumatic, normocephalic. Oropharynx and nasopharynx clear.  NECK:  Supple, no jugular venous distention. No thyroid enlargement, no tenderness.  LUNGS: Good A/E b/l, no wheezing, no rhonchi,crackles at bases. No use of accessory muscles of respiration.  CARDIOVASCULAR: S1, S2 normal. No murmurs, rubs, or gallops.  ABDOMEN: Soft, nontender, nondistended. Bowel sounds present. No organomegaly or mass.  EXTREMITIES: No cyanosis, clubbing or mild edema b/l NEUROLOGIC: Cranial nerves II through XII are intact. No focal Motor or sensory deficits b/l.    PSYCHIATRIC: The patient is alert and oriented x 3.  SKIN: No obvious rash, lesion, or ulcer.   Right chest wall  Perm cath in place for HD.    DATA REVIEW:   CBC  Recent Labs Lab 02/13/16 1417  WBC 10.3  HGB 7.7*  HCT 23.2*  PLT 300    Chemistries   Recent Labs Lab 02/13/16 1417  NA 133*  K 3.8  CL 96*  CO2 30  GLUCOSE 134*  BUN 25*  CREATININE 2.93*  CALCIUM 7.8*    Cardiac Enzymes  Recent Labs Lab 02/08/16 [place] <0.03    Microbiology Results  No results found for this or any previous visit.  RADIOLOGY:  Ct Chest Wo Contrast  Result Date: 02/13/2016 CLINICAL DATA:  Shortness of breath and hypoxia. Bilateral gallstone. Heart transplant patient. Chronic kidney disease. EXAM: CT CHEST WITHOUT CONTRAST TECHNIQUE: Multidetector CT imaging of the chest was performed following the standard protocol without IV contrast. COMPARISON:  Chest radiograph on 02/10/2016 FINDINGS: Cardiovascular: Heart size is at the upper limits of normal. Aortic atherosclerosis. Right jugular central venous dialysis catheter with tip in the right atrium. Mediastinum/Nodes: No definite lymphadenopathy identified on this unenhanced exam. A 2.3 cm low-attenuation left thyroid lobe nodule is seen. Lungs/Pleura: Small multilocular bilateral pleural effusions are seen, with extension into the right major fissure. Diffuse bilateral airspace disease is seen likely due to pulmonary edema. No definite pulmonary mass identified. Upper Abdomen:  Diffuse body wall edema. Musculoskeletal:  No suspicious bone lesions. IMPRESSION: Diffuse bilateral airspace disease, small multilocular bilateral pleural effusions, and diffuse body wall edema, likely due to congestive heart failure. 2.3 cm low-attenuation left thyroid lobe nodule. Thyroid ultrasound recommended for further evaluation . This follows ACR consensus guidelines: Managing Incidental Thyroid Nodules Detected on Imaging: White Paper of the ACR  Incidental Thyroid Findings Committee. J Am Coll Radiol 2015; 12:143-150. Aortic atherosclerosis. Electronically Signed   By: Earle Gell M.D.   On: 02/13/2016 19:49      Management plans discussed with the patient, family and they are in agreement.  CODE STATUS:     Code Status Orders        Start     Ordered   02/08/16 1620  Full code  Continuous     02/08/16 1619    Code Status History  Date Active Date Inactive Code Status Order ID Comments User Context   This patient has a current code status but no historical code status.    Advance Directive Documentation   Flowsheet Row Most Recent Value  Type of Advance Directive  Healthcare Power of Attorney  Pre-existing out of facility DNR order (yellow form or pink MOST form)  No data  "MOST" Form in Place?  No data      TOTAL TIME TAKING CARE OF THIS PATIENT: 45 minutes.    Henreitta Leber M.D on 02/14/2016 at 4:05 PM  Between 7am to 6pm - Pager - [phone]  After 6pm go to [email or web address] - Proofreader  [facility]  Office  [phone]  CC: Primary care physician; Diamond Nickel, MD

## 2016-02-15 NOTE — Care Management Note (Signed)
Case Management Note  Patient Details  Name: Ralph Allison MRN: [id number] Date of Birth: 09-24-1950  Subjective/Objective:      Discharge Summary was faxed to Alda Lea at [facility]..               Action/Plan:   Expected Discharge Date:                  Expected Discharge Plan:  Home/Self Care  In-House Referral:     Discharge planning Services  CM Consult  Post Acute Care Choice:  Durable Medical Equipment Choice offered to:  Patient  DME Arranged:  Oxygen DME Agency:  [place]:    [place] Agency:     Status of Service:  In process, will continue to follow  If discussed at Long Length of Stay Meetings, dates discussed:    Additional Comments:  Jerra Huckeby A, RN 02/15/2016, 10:17 AM

## 2016-02-20 NOTE — Care Management (Signed)
Notified by Gwinda Passe from [facility] that they have attempted to set up services post discharge.  Patient declined services.  Stated they were "to busy" and requested for services to be initiated after the first of January.  Case Closed

## 2016-05-30 ENCOUNTER — Other Ambulatory Visit (INDEPENDENT_AMBULATORY_CARE_PROVIDER_SITE_OTHER): Payer: Self-pay | Admitting: Vascular Surgery

## 2016-05-30 ENCOUNTER — Encounter (INDEPENDENT_AMBULATORY_CARE_PROVIDER_SITE_OTHER): Payer: Self-pay

## 2016-06-03 ENCOUNTER — Encounter: Payer: Self-pay | Admitting: Vascular Surgery

## 2016-06-03 ENCOUNTER — Ambulatory Visit
Admission: RE | Admit: 2016-06-03 | Discharge: 2016-06-03 | Disposition: A | Discharge status: Home / Self Care | Payer: Medicare Other | Source: Ambulatory Visit | Attending: Vascular Surgery | Admitting: Vascular Surgery

## 2016-06-03 ENCOUNTER — Encounter
Admission: RE | Disposition: A | Discharge status: Home / Self Care | Payer: Self-pay | Source: Ambulatory Visit | Attending: Vascular Surgery

## 2016-06-03 DIAGNOSIS — Z8249 Family history of ischemic heart disease and other diseases of the circulatory system: Secondary | ICD-10-CM | POA: Insufficient documentation

## 2016-06-03 DIAGNOSIS — E1122 Type 2 diabetes mellitus with diabetic chronic kidney disease: Secondary | ICD-10-CM | POA: Insufficient documentation

## 2016-06-03 DIAGNOSIS — N186 End stage renal disease: Secondary | ICD-10-CM

## 2016-06-03 DIAGNOSIS — Z7982 Long term (current) use of aspirin: Secondary | ICD-10-CM | POA: Diagnosis not present

## 2016-06-03 DIAGNOSIS — T82868A Thrombosis of vascular prosthetic devices, implants and grafts, initial encounter: Secondary | ICD-10-CM

## 2016-06-03 DIAGNOSIS — Z992 Dependence on renal dialysis: Secondary | ICD-10-CM | POA: Insufficient documentation

## 2016-06-03 DIAGNOSIS — Z941 Heart transplant status: Secondary | ICD-10-CM | POA: Insufficient documentation

## 2016-06-03 DIAGNOSIS — Z888 Allergy status to other drugs, medicaments and biological substances status: Secondary | ICD-10-CM | POA: Insufficient documentation

## 2016-06-03 DIAGNOSIS — Z452 Encounter for adjustment and management of vascular access device: Secondary | ICD-10-CM | POA: Diagnosis present

## 2016-06-03 DIAGNOSIS — I10 Essential (primary) hypertension: Secondary | ICD-10-CM

## 2016-06-03 DIAGNOSIS — E119 Type 2 diabetes mellitus without complications: Secondary | ICD-10-CM

## 2016-06-03 DIAGNOSIS — I120 Hypertensive chronic kidney disease with stage 5 chronic kidney disease or end stage renal disease: Secondary | ICD-10-CM | POA: Diagnosis not present

## 2016-06-03 HISTORY — PX: DIALYSIS/PERMA CATHETER REMOVAL: CATH118289

## 2016-06-03 SURGERY — DIALYSIS/PERMA CATHETER REMOVAL
Anesthesia: Moderate Sedation

## 2016-06-03 SURGICAL SUPPLY — 5 items
FORCEPS HALSTEAD CVD 5IN STRL (INSTRUMENTS) ×2 IMPLANT
GLOVE SURG SYN 7.5  E (GLOVE) ×1
GLOVE SURG SYN 7.5 E (GLOVE) ×1 IMPLANT
GLOVE SURG SYN 8.0 (GLOVE) ×2 IMPLANT
TRAY LACERAT/PLASTIC (MISCELLANEOUS) ×2 IMPLANT

## 2016-06-03 NOTE — Op Note (Signed)
  OPERATIVE NOTE   PROCEDURE: 1. Removal of a right IJ tunneled dialysis catheter  PRE-OPERATIVE DIAGNOSIS: Complication of dialysis catheter, End stage renal disease  POST-OPERATIVE DIAGNOSIS: Same  SURGEON: Hortencia Pilar, M.D.  ANESTHESIA: Local anesthetic with 1% lidocaine with epinephrine   ESTIMATED BLOOD LOSS: Minimal   FINDING(S): 1. Catheter intact   SPECIMEN(S):  Catheter  INDICATIONS:   Ralph Allison is a 66 y.o. male who presents with nonfunctioning right IJ tunneled catheter.  The patient has undergone placement of an extremity access which is working and this has been successfully cannulated without difficulty.  therefore is undergoing removal of his tunneled catheter which is no longer needed to avoid septic complications.   DESCRIPTION: After obtaining full informed written consent, the patient was positioned supine. The right IJ tunneled catheter and surrounding area is prepped and draped in a sterile fashion. The cuff was localized by palpation and noted to be less than 3 cm from the exit site. After appropriate timeout is called, 1% lidocaine with epinephrine is infiltrated into the surrounding tissues around the cuff. Small transverse incision is created at the exit site with an 11 blade scalpel and the dissection was carried up along the catheter to expose the cuff of the tunneled catheter.  The catheter cuff is then freed from the surrounding attachments and adhesions. Once the catheter has been freed circumferentially it is removed in 1 piece. Light pressure was held at the base of the neck.   Antibiotic ointment and a sterile dressing is applied to the exit site. Patient tolerated procedure well and there were no complications.  COMPLICATIONS: None  CONDITION: Unchanged  Hortencia Pilar, M.D. [facility] Office: [phone]  06/03/2016,1:13 PM

## 2016-06-03 NOTE — H&P (Signed)
[facility] Admission History & Physical  MRN : [id number]  Ralph Allison is a 66 y.o. ([birth year]) male who presents with chief complaint of my catheter is not working I am here to have it removed.  History of Present Illness: I am asked to evaluate the patient by the dialysis center. The patient was sent here because they have a nonfunctioning tunneled catheter and a functioning arm access.  The patient reports they're not been any problems with any of their dialysis runs. They are reporting good flows with good parameters at dialysis.  Patient denies pain or tenderness overlying the access.  There is no pain with dialysis.  The patient denies hand pain or finger pain consistent with steal syndrome.  No fevers or chills while on dialysis.   No current facility-administered medications for this encounter.    Current Outpatient Prescriptions  Medication Sig Dispense Refill  . amLODipine (NORVASC) 5 MG tablet Take 5 mg by mouth 2 (two) times daily.    Marland Kitchen aspirin EC 81 MG tablet Take 81 mg by mouth every 3 (three) days.     . carvedilol (COREG) 25 MG tablet Take 25 mg by mouth every 12 (twelve) hours.     . mycophenolate (CELLCEPT) 500 MG tablet Take 500 mg by mouth 2 (two) times daily.     . pantoprazole (PROTONIX) 40 MG tablet Take 40 mg by mouth daily.     . pravastatin (PRAVACHOL) 40 MG tablet Take 40 mg by mouth at bedtime.     . tacrolimus (PROGRAF) 0.5 MG capsule Take 1 mg by mouth 2 (two) times daily.       Past Medical History:  Diagnosis Date  . Diabetes mellitus without complication ([place])   . 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 Renal disorder     Past Surgical History:  Procedure Laterality Date  . HEART TRANSPLANT    . PERIPHERAL VASCULAR CATHETERIZATION N/A 02/08/2016   Procedure: Dialysis/Perma Catheter Insertion;  Surgeon: Katha Cabal, MD;  Location: [facility];  Service: Cardiovascular;  Laterality: N/A;    Social  History Social History  Substance Use Topics  . Smoking status: Never Smoker  . Smokeless tobacco: Never Used  . Alcohol use No    Family History Family History  Problem Relation Age of Onset  . Hypertension Father     No family history of bleeding or clotting disorders, autoimmune disease or porphyria  Allergies  Allergen Reactions  . Heparin     Heart transplant  . Hydralazine Swelling     REVIEW OF SYSTEMS (Negative unless checked)  Constitutional: $RemoveBeforeDE'[]'ULPDPuCZHdPIBCb$ Weight loss  $Rem'[]'qFWn$ Fever  $Remo'[]'qxEDC$ Chills Cardiac: $RemoveBeforeD'[]'FQGXEBmrYvNYgz$ Chest pain   '[]'$ Chest pressure   '[]'$ Palpitations   '[]'$ Shortness of breath when laying flat   '[]'$ Shortness of breath at rest   '[x]'$ Shortness of breath with exertion. Vascular:  $RemoveBe'[]'TImkExZHa$ Pain in legs with walking   '[]'$ Pain in legs at rest   '[]'$ Pain in legs when laying flat   '[]'$ Claudication   '[]'$ Pain in feet when walking  $Remove'[]'lLDGgyd$ Pain in feet at rest  $Rem'[]'vFNp$ Pain in feet when laying flat   '[]'$ History of DVT   '[]'$ Phlebitis   '[]'$ Swelling in legs   '[]'$ Varicose veins   '[]'$ Non-healing ulcers Pulmonary:   '[]'$ Uses home oxygen   '[]'$ Productive cough   '[]'$ Hemoptysis   '[]'$ Wheeze  $Remov'[]'plEkmF$ COPD   '[]'$ Asthma Neurologic:  $RemoveBefo'[]'fOPAQGfoRuv$ Dizziness  $RemoveBe'[]'shDXlLrVz$ Blackouts   '[]'$ Seizures   '[]'$ History of stroke   '[]'$ History of TIA  $Re'[]'eBA$ Aphasia   '[]'$ Temporary blindness   '[]'$   Dysphagia   [] Weakness or numbness in arms   [] Weakness or numbness in legs Musculoskeletal:  [] Arthritis   [] Joint swelling   [] Joint pain   [] Low back pain Hematologic:  [] Easy bruising  [] Easy bleeding   [] Hypercoagulable state   [] Anemic  [] Hepatitis Gastrointestinal:  [] Blood in stool   [] Vomiting blood  [] Gastroesophageal reflux/heartburn   [] Difficulty swallowing. Genitourinary:  [x] Chronic kidney disease   [] Difficult urination  [] Frequent urination  [] Burning with urination   [] Blood in urine Skin:  [] Rashes   [] Ulcers   [] Wounds Psychological:  [] History of anxiety   []  History of major depression.  Physical Examination  Vitals:   06/03/16 1134  BP: (!) 147/81  Resp: 18  Temp: 98.7 F (37.1 C)   TempSrc: Oral  SpO2: 100%   There is no height or weight on file to calculate BMI. Gen: WD/WN, NAD Head: [place]/AT, No temporalis wasting. Prominent temp pulse not noted. Ear/Nose/Throat: Hearing grossly intact, nares w/o erythema or drainage, oropharynx w/o Erythema/Exudate,  Eyes: Conjunctiva clear, sclera non-icteric Neck: Trachea midline.  No JVD.  Pulmonary:  Good air movement, respirations not labored, no use of accessory muscles.  Cardiac: RRR, normal S1, S2. Vascular: Right IJ tunneled catheter nontender no erythema noted drainage Vessel Right Left  Radial Palpable Palpable  Ulnar Not Palpable Not Palpable  Brachial Palpable Palpable  Carotid Palpable, without bruit Palpable, without bruit  Gastrointestinal: soft, non-tender/non-distended. No guarding/reflex.  Musculoskeletal: M/S 5/5 throughout.  Extremities without ischemic changes.  No deformity or atrophy.  Neurologic: Sensation grossly intact in extremities.  Symmetrical.  Speech is fluent. Motor exam as listed above. Psychiatric: Judgment intact, Mood & affect appropriate for pt's clinical situation. Dermatologic: No rashes or ulcers noted.  No cellulitis or open wounds. Lymph : No Cervical, Axillary, or Inguinal lymphadenopathy.   CBC Lab Results  Component Value Date   WBC 10.3 02/13/2016   HGB 7.7 (L) 02/13/2016   HCT 23.2 (L) 02/13/2016   MCV 85.7 02/13/2016   PLT 300 02/13/2016    BMET    Component Value Date/Time   NA 133 (L) 02/13/2016 1417   K 3.8 02/13/2016 1417   CL 96 (L) 02/13/2016 1417   CO2 30 02/13/2016 1417   GLUCOSE 134 (H) 02/13/2016 1417   BUN 25 (H) 02/13/2016 1417   CREATININE 2.93 (H) 02/13/2016 1417   CALCIUM 7.8 (L) 02/13/2016 1417   GFRNONAA 21 (L) 02/13/2016 1417   GFRAA 24 (L) 02/13/2016 1417   CrCl cannot be calculated (Patient's most recent lab result is older than the maximum 21 days allowed.).  COAG No results found for: INR, PROTIME  Radiology No results  found.  Assessment/Plan 1.  Complication dialysis device with thrombosis AV access:  Patient's Right IJ Tunneled catheter is malfunctioning. Therefore, the patient will undergo removal of the tunneled catheter under local anesthesia.  The risks and benefits were described to the patient.  All questions were answered.  The patient agrees to proceed with angiography and intervention. Potassium will be drawn to ensure that it is an appropriate level prior to performing intervention. 2.  End-stage renal disease requiring hemodialysis:  Patient will continue dialysis therapy without further interruption if a successful intervention is not achieved then a tunneled catheter will be placed. Dialysis has already been arranged. 3.  Hypertension:  Patient will continue medical management; nephrology is following no changes in oral medications. 4. Diabetes mellitus:  Glucose will be monitored and oral medications been held this morning once the patient has  undergone the patient's procedure po intake will be reinitiated and again Accu-Cheks will be used to assess the blood glucose level and treat as needed. The patient will be restarted on the patient's usual hypoglycemic regime    Hortencia Pilar, MD  06/03/2016 1:09 PM

## 2016-09-02 ENCOUNTER — Emergency Department
Admission: EM | Admit: 2016-09-02 | Discharge: 2016-09-02 | Disposition: A | Discharge status: Home / Self Care | Payer: Medicare Other | Attending: Emergency Medicine | Admitting: Emergency Medicine

## 2016-09-02 ENCOUNTER — Emergency Department: Payer: Medicare Other

## 2016-09-02 ENCOUNTER — Encounter: Payer: Self-pay | Admitting: Emergency Medicine

## 2016-09-02 DIAGNOSIS — I120 Hypertensive chronic kidney disease with stage 5 chronic kidney disease or end stage renal disease: Secondary | ICD-10-CM | POA: Diagnosis not present

## 2016-09-02 DIAGNOSIS — E119 Type 2 diabetes mellitus without complications: Secondary | ICD-10-CM | POA: Diagnosis not present

## 2016-09-02 DIAGNOSIS — Z79899 Other long term (current) drug therapy: Secondary | ICD-10-CM | POA: Insufficient documentation

## 2016-09-02 DIAGNOSIS — Z7982 Long term (current) use of aspirin: Secondary | ICD-10-CM | POA: Diagnosis not present

## 2016-09-02 DIAGNOSIS — N186 End stage renal disease: Secondary | ICD-10-CM | POA: Diagnosis not present

## 2016-09-02 DIAGNOSIS — Z992 Dependence on renal dialysis: Secondary | ICD-10-CM | POA: Diagnosis not present

## 2016-09-02 DIAGNOSIS — R112 Nausea with vomiting, unspecified: Secondary | ICD-10-CM | POA: Diagnosis not present

## 2016-09-02 DIAGNOSIS — R42 Dizziness and giddiness: Secondary | ICD-10-CM | POA: Diagnosis not present

## 2016-09-02 LAB — COMPREHENSIVE METABOLIC PANEL
ALBUMIN: 5.1 g/dL — AB (ref 3.5–5.0)
ALK PHOS: 95 U/L (ref 38–126)
ALT: 7 U/L — AB (ref 17–63)
AST: 18 U/L (ref 15–41)
Anion gap: 13 (ref 5–15)
BILIRUBIN TOTAL: 0.8 mg/dL (ref 0.3–1.2)
BUN: 28 mg/dL — AB (ref 6–20)
CALCIUM: 10.1 mg/dL (ref 8.9–10.3)
CO2: 26 mmol/L (ref 22–32)
CREATININE: 5.3 mg/dL — AB (ref 0.61–1.24)
Chloride: 97 mmol/L — ABNORMAL LOW (ref 101–111)
GFR calc Af Amer: 12 mL/min — ABNORMAL LOW (ref >60)
GFR, EST NON AFRICAN AMERICAN: 10 mL/min — AB (ref >60)
GLUCOSE: 118 mg/dL — AB (ref 65–99)
POTASSIUM: 3.8 mmol/L (ref 3.5–5.1)
Sodium: 136 mmol/L (ref 135–145)
TOTAL PROTEIN: 8.3 g/dL — AB (ref 6.5–8.1)

## 2016-09-02 LAB — PHOSPHORUS: PHOSPHORUS: 4.1 mg/dL (ref 2.5–4.6)

## 2016-09-02 LAB — CBC WITH DIFFERENTIAL/PLATELET
BASOS PCT: 0 %
Basophils Absolute: 0 10*3/uL (ref 0–0.1)
EOS ABS: 0 10*3/uL (ref 0–0.7)
EOS PCT: 1 %
HCT: 39.2 % — ABNORMAL LOW (ref 40.0–52.0)
HEMOGLOBIN: 12.7 g/dL — AB (ref 13.0–18.0)
LYMPHS ABS: 0.7 10*3/uL — AB (ref 1.0–3.6)
Lymphocytes Relative: 12 %
MCH: 28.8 pg (ref 26.0–34.0)
MCHC: 32.5 g/dL (ref 32.0–36.0)
MCV: 88.7 fL (ref 80.0–100.0)
MONO ABS: 0.4 10*3/uL (ref 0.2–1.0)
MONOS PCT: 6 %
NEUTROS PCT: 81 %
Neutro Abs: 5.2 10*3/uL (ref 1.4–6.5)
Platelets: 190 10*3/uL (ref 150–440)
RBC: 4.42 MIL/uL (ref 4.40–5.90)
RDW: 16.7 % — AB (ref 11.5–14.5)
WBC: 6.3 10*3/uL (ref 3.8–10.6)

## 2016-09-02 LAB — URINALYSIS, COMPLETE (UACMP) WITH MICROSCOPIC
Bacteria, UA: NONE SEEN
Bilirubin Urine: NEGATIVE
Glucose, UA: NEGATIVE mg/dL
Ketones, ur: NEGATIVE mg/dL
Leukocytes, UA: NEGATIVE
NITRITE: NEGATIVE
PH: 8 (ref 5.0–8.0)
Protein, ur: 100 mg/dL — AB
SPECIFIC GRAVITY, URINE: 1.009 (ref 1.005–1.030)

## 2016-09-02 LAB — TROPONIN I
Troponin I: <0.03 ng/mL (ref <0.03)
Troponin I: <0.03 ng/mL (ref <0.03)

## 2016-09-02 LAB — LIPASE, BLOOD: LIPASE: 34 U/L (ref 11–51)

## 2016-09-02 LAB — MAGNESIUM: MAGNESIUM: 2 mg/dL (ref 1.7–2.4)

## 2016-09-02 MED ORDER — SODIUM CHLORIDE 0.9 % IV BOLUS (SEPSIS)
500.0000 mL | Freq: Once | INTRAVENOUS | Status: AC
  Administered 2016-09-02: 500 mL via INTRAVENOUS

## 2016-09-02 MED ORDER — ONDANSETRON HCL 4 MG/2ML IJ SOLN
4.0000 mg | Freq: Once | INTRAMUSCULAR | Status: AC
  Administered 2016-09-02: 4 mg via INTRAVENOUS
  Filled 2016-09-02: qty 2

## 2016-09-02 MED ORDER — ONDANSETRON 4 MG PO TBDP: 4.0000 mg | ORAL_TABLET | Freq: Three times a day (TID) | ORAL | 0 refills | Status: DC | PRN

## 2016-09-02 NOTE — Discharge Instructions (Signed)
Results for orders placed or performed during the hospital encounter of 09/02/16  Comprehensive metabolic panel  Result Value Ref Range   Sodium 136 135 - 145 mmol/L   Potassium 3.8 3.5 - 5.1 mmol/L   Chloride 97 (L) 101 - 111 mmol/L   CO2 26 22 - 32 mmol/L   Glucose, Bld 118 (H) 65 - 99 mg/dL   BUN 28 (H) 6 - 20 mg/dL   Creatinine, Ser 5.30 (H) 0.61 - 1.24 mg/dL   Calcium 10.1 8.9 - 10.3 mg/dL   Total Protein 8.3 (H) 6.5 - 8.1 g/dL   Albumin 5.1 (H) 3.5 - 5.0 g/dL   AST 18 15 - 41 U/L   ALT 7 (L) 17 - 63 U/L   Alkaline Phosphatase 95 38 - 126 U/L   Total Bilirubin 0.8 0.3 - 1.2 mg/dL   GFR calc non Af Amer 10 (L) >60 mL/min   GFR calc Af Amer 12 (L) >60 mL/min   Anion gap 13 5 - 15  Lipase, blood  Result Value Ref Range   Lipase 34 11 - 51 U/L  Troponin I  Result Value Ref Range   Troponin I <0.03 <0.03 ng/mL  CBC with Differential  Result Value Ref Range   WBC 6.3 3.8 - 10.6 K/uL   RBC 4.42 4.40 - 5.90 MIL/uL   Hemoglobin 12.7 (L) 13.0 - 18.0 g/dL   HCT 39.2 (L) 40.0 - 52.0 %   MCV 88.7 80.0 - 100.0 fL   MCH 28.8 26.0 - 34.0 pg   MCHC 32.5 32.0 - 36.0 g/dL   RDW 16.7 (H) 11.5 - 14.5 %   Platelets 190 150 - 440 K/uL   Neutrophils Relative % 81 %   Neutro Abs 5.2 1.4 - 6.5 K/uL   Lymphocytes Relative 12 %   Lymphs Abs 0.7 (L) 1.0 - 3.6 K/uL   Monocytes Relative 6 %   Monocytes Absolute 0.4 0.2 - 1.0 K/uL   Eosinophils Relative 1 %   Eosinophils Absolute 0.0 0 - 0.7 K/uL   Basophils Relative 0 %   Basophils Absolute 0.0 0 - 0.1 K/uL  Urinalysis, Complete w Microscopic  Result Value Ref Range   Color, Urine YELLOW (A) YELLOW   APPearance CLEAR (A) CLEAR   Specific Gravity, Urine 1.009 1.005 - 1.030   pH 8.0 5.0 - 8.0   Glucose, UA NEGATIVE NEGATIVE mg/dL   Hgb urine dipstick SMALL (A) NEGATIVE   Bilirubin Urine NEGATIVE NEGATIVE   Ketones, ur NEGATIVE NEGATIVE mg/dL   Protein, ur 100 (A) NEGATIVE mg/dL   Nitrite NEGATIVE NEGATIVE   Leukocytes, UA NEGATIVE  NEGATIVE   RBC / HPF 0-5 0 - 5 RBC/hpf   WBC, UA 0-5 0 - 5 WBC/hpf   Bacteria, UA NONE SEEN NONE SEEN   Squamous Epithelial / LPF 0-5 (A) NONE SEEN  Phosphorus  Result Value Ref Range   Phosphorus 4.1 2.5 - 4.6 mg/dL  Magnesium  Result Value Ref Range   Magnesium 2.0 1.7 - 2.4 mg/dL  Troponin I  Result Value Ref Range   Troponin I <0.03 <0.03 ng/mL

## 2016-09-02 NOTE — ED Notes (Signed)
Water given at this time, will see how pt does with PO challenge

## 2016-09-02 NOTE — ED Triage Notes (Signed)
Pt states he has vomited 4 times no abd pain, no diarrhea. Lightheaded - has not eating. Hx of dialysis m,w,f, and went yesterday. Also heart transplate 2010

## 2016-09-02 NOTE — ED Notes (Signed)
ED Provider at bedside. 

## 2016-09-02 NOTE — ED Notes (Signed)
Pt denies any nausea at this time.

## 2016-09-02 NOTE — ED Provider Notes (Signed)
[facility] Emergency Department Provider Note  ____________________________________________  Time seen: Approximately 4:12 PM  I have reviewed the triage vital signs and the nursing notes.   HISTORY  Chief Complaint Emesis and Dizziness    HPI Ralph Allison is a 66 y.o. male with a history of diabetes, end-stage renal disease on hemodialysis, and heart transplant, who complains of vomiting and lightheadedness that started today at about 12:30 PM. He was in his usual state of health this morning, ate breakfast as usual, took all his morning medications, and then later in the morning he was changing a battery in the upper floor of the house where it was very hot. After that he felt lightheaded and started vomiting. He denies any pain whatsoever and reports that he currently feels better.  No abdominal pain shortness of breath chest pain or back pain. No headache vision changes numbness tingling or weakness. He does still urinate and he reports no changes from baseline pattern of urination. He went to seek routine dialysis session yesterday. His transplant medicine and cardiology is through [facility].     Past Medical History:  Diagnosis Date  . Diabetes mellitus without complication ([place])   . Heart transplant recipient [facility]) 2009  . Renal disorder    Allergies   Active Allergy Reactions Severity Noted Date Comments  Heparin Analogues Other (See Comments) High  Other Reaction: HIT (unclear history as patient has 3 negative HIT panels)    Patient HIT panels from 2009 and 2010 were all negative. Heme note from 07/01/16 rec's heparin avoidance   Hydralazine Angioedema, Swelling  09/13/2015    Current Medications   Prescription Sig. Disp. Refills Start Date End Date Status  acetaminophen (TYLENOL) 500 MG tablet  1 tab by mouth every 6 hours as needed   08/03/2009  Active  aspirin (ADULT LOW DOSE ASPIRIN) 81 MG EC tablet  Take 1 tablet by mouth every  other day. Takes one day then skips two days   08/03/2009  Active  cyanocobalamin (VITAMIN B12) 1000 MCG tablet  Take 1,000 mcg by mouth once daily.     Active  aspirin 81 MG EC tablet  Take by mouth.   08/03/2009  Active  tacrolimus (PROGRAF) 0.5 MG capsule  Take by mouth.     Active  tacrolimus (PROGRAF) 1 MG capsule  Take 1 capsule (1 mg total) by mouth every 12 (twelve) hours. 60 capsule  11 07/08/2016 07/08/2017 Active  amLODIPine (NORVASC) 5 MG tablet  Take 1 tablet (5 mg total) by mouth once daily. 90 tablet  3 07/08/2016  Active  amLODIPine (NORVASC) 5 MG tablet  Take 1 tablet (5 mg total) by mouth 2 (two) times daily. 180 tablet  3 07/08/2016  Active  carvedilol (COREG) 25 MG tablet  Take 1 tablet (25 mg total) by mouth 2 (two) times daily. 180 tablet  3 07/08/2016 07/08/2017 Active  pantoprazole (PROTONIX) 40 MG DR tablet  Take 1 tablet (40 mg total) by mouth once daily. 90 tablet  3 07/08/2016  Active  pravastatin (PRAVACHOL) 40 MG tablet  Take 1 tablet (40 mg total) by mouth nightly. 90 tablet  3 07/08/2016  Active  FUROsemide (LASIX) 20 MG tablet  Take 1 tablet (20 mg total) by mouth 2 (two) times daily. 180 tablet  3 07/08/2016 07/08/2017 Active  mycophenolate (CELLCEPT) 500 mg tablet  Take 1 tablet (500 mg total) by mouth 2 (two) times daily. 180 tablet  3 07/09/2016  Active  mycophenolate (CELLCEPT)  250 mg capsule  Take 2 capsules (500 mg total) by mouth 2 (two) times daily. 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 Acute renal failure (ARF) (HCC) 02/08/2016     Past Surgical History:  Procedure Laterality Date  . DIALYSIS/PERMA CATHETER REMOVAL N/A 06/03/2016   Procedure: Dialysis/Perma Catheter Removal;  Surgeon: Renford Dills, MD;  Location: [facility] INVASIVE CV LAB;  Service: Cardiovascular;  Laterality: N/A;  . HEART TRANSPLANT    . PERIPHERAL VASCULAR CATHETERIZATION N/A 02/08/2016   Procedure: Dialysis/Perma Catheter Insertion;  Surgeon: Renford Dills, MD;  Location: [facility] INVASIVE CV LAB;  Service: Cardiovascular;  Laterality: N/A;     Prior to Admission medications   Medication Sig Start Date End Date Taking? Authorizing Provider  amLODipine (NORVASC) 5 MG tablet Take 5 mg by mouth 2 (two) times daily.    [provider]  aspirin EC 81 MG tablet Take 81 mg by mouth every 3 (three) days.  08/03/09   [provider]  carvedilol (COREG) 25 MG tablet Take 25 mg by mouth every 12 (twelve) hours.  09/09/15 09/08/16  [provider]  mycophenolate (CELLCEPT) 500 MG tablet Take 500 mg by mouth 2 (two) times daily.  12/19/15   [provider]  ondansetron (ZOFRAN ODT) 4 MG disintegrating tablet Take 1 tablet (4 mg total) by mouth every 8 (eight) hours as needed for nausea or vomiting. 09/02/16   Sharman Cheek, MD  pantoprazole (PROTONIX) 40 MG tablet Take 40 mg by mouth daily.  04/04/15   [provider]  pravastatin (PRAVACHOL) 40 MG tablet Take 40 mg by mouth at bedtime.     [provider]  tacrolimus (PROGRAF) 0.5 MG capsule Take 1 mg by mouth 2 (two) times daily.     [provider]     Allergies Heparin and Hydralazine   Family History  Problem Relation Age of Onset  . Hypertension Father     Social History Social History    Substance Use Topics  . Smoking status: Never Smoker  . Smokeless tobacco: Never Used  . Alcohol use No    Review of Systems  Constitutional:   No fever or chills.  ENT:   No sore throat. No rhinorrhea. Cardiovascular:   No chest pain or syncope. Respiratory:   No dyspnea or cough. Gastrointestinal:   Negative for abdominal pain, Positive as above for vomiting. No constipation or diarrhea.  Musculoskeletal:   Negative for focal pain or swelling All other systems reviewed and are negative except as documented above in ROS and HPI.  ____________________________________________   PHYSICAL EXAM:  VITAL SIGNS: ED Triage Vitals [09/02/16 1600]  Enc Vitals Group     BP      Pulse      Resp      Temp      Temp src      SpO2      Weight 97  lb (44 kg)     Height 5\' 1"  (1.549 m)     Head Circumference      Peak Flow      Pain Score      Pain Loc      Pain Edu?      Excl. in GC?     Vital signs reviewed, nursing assessments reviewed.   Constitutional:   Alert and oriented. Well appearing and in no distress.Low body weight Eyes:   No scleral icterus.  EOMI. No nystagmus. No conjunctival pallor. PERRL. ENT   Head:   Normocephalic and atraumatic.   Nose:   No congestion/rhinnorhea.    Mouth/Throat:   MMM, no pharyngeal erythema. No peritonsillar mass.    Neck:   No meningismus. Full ROM. Distended neck veins Hematological/Lymphatic/Immunilogical:   No cervical lymphadenopathy. Cardiovascular:   RRR. Symmetric bilateral radial and DP pulses.  No murmurs.  Respiratory:   Normal respiratory effort without tachypnea/retractions. Breath sounds are clear and equal bilaterally. No wheezes/rales/rhonchi. Gastrointestinal:   Soft and nontender. Non distended. There is no CVA tenderness.  No rebound, rigidity, or guarding. No pulsatile mass or bruit. Genitourinary:   deferred Musculoskeletal:   Normal range of motion in all extremities. No joint effusions.  No lower  extremity tenderness.  No edema. Neurologic:   Normal speech and language.  Motor grossly intact. No gross focal neurologic deficits are appreciated.  Skin:    Skin is warm, dry and intact. No rash noted.  No petechiae, purpura, or bullae.  ____________________________________________    LABS (pertinent positives/negatives) (all labs ordered are listed, but only abnormal results are displayed) Labs Reviewed  COMPREHENSIVE METABOLIC PANEL - Abnormal; Notable for the following:       Result Value   Chloride 97 (*)    Glucose, Bld 118 (*)    BUN 28 (*)    Creatinine, Ser 5.30 (*)    Total Protein 8.3 (*)    Albumin 5.1 (*)    ALT 7 (*)    GFR calc non Af Amer 10 (*)    GFR calc Af Amer 12 (*)    All other components within normal limits  CBC WITH DIFFERENTIAL/PLATELET - Abnormal; Notable for the following:    Hemoglobin 12.7 (*)    HCT 39.2 (*)    RDW 16.7 (*)    Lymphs Abs 0.7 (*)    All other components within normal limits  URINALYSIS, COMPLETE (UACMP) WITH MICROSCOPIC - Abnormal; Notable for the following:    Color, Urine YELLOW (*)    APPearance CLEAR (*)    Hgb urine dipstick SMALL (*)    Protein, ur 100 (*)    Squamous Epithelial / LPF 0-5 (*)    All other components within normal limits  URINE CULTURE  LIPASE, BLOOD  TROPONIN I  PHOSPHORUS  MAGNESIUM  TROPONIN I   ____________________________________________   EKG  Interpreted by me Sinus rhythm rate of 79, right axis. Right bundle branch block. T-wave inversions in 1 aVL and V2 V3. This appears to be new compared to 02/08/2016..  ____________________________________________    RADIOLOGY  Dg Abdomen Acute W/chest  Result Date: 09/02/2016 CLINICAL DATA:  Patient reports vomiting onset today at noon. Denies diarrhea. Patient receives dialysis 3x/week. Received dialysis yesterday. Heart transplant in 2010. Non-smoker. EXAM: DG ABDOMEN ACUTE W/ 1V CHEST COMPARISON:  02/13/2016 chest CT and 02/10/2016 chest  radiograph. FINDINGS: Frontal view of the chest demonstrates midline trachea. Prior median sternotomy. Mild cardiomegaly. No pleural effusion  or pneumothorax. Improved aeration with right greater left apical predominant airspace disease and moderate interstitial thickening remaining. Abdominal films demonstrate no free intraperitoneal air or significant air-fluid levels on upright positioning. Probable right nephrolithiasis. No bowel distention on supine imaging. Convex left lumbar spine curvature. IMPRESSION: No acute findings in the abdomen. Probable right nephrolithiasis. Significantly improved aeration since 02/10/2016 with biapical pulmonary opacities interstitial thickening remaining. This could represent the sequelae of prior infection. Residual or recurrent infection cannot be entirely excluded. Electronically Signed   By: Abigail Miyamoto M.D.   On: 09/02/2016 17:22    ____________________________________________   PROCEDURES Procedures  ____________________________________________   INITIAL IMPRESSION / ASSESSMENT AND PLAN / ED COURSE  Pertinent labs & imaging results that were available during my care of the patient were reviewed by me and considered in my medical decision making (see chart for details).  Patient presents with vomiting, denies any pain complaints. With his history of heart transplant, he is at risk for silent MI. We'll check a delta troponin, give Zofran and IV fluids for hydration and continue to monitor in the ED. We'll x-ray the chest and abdomen and obtain serum and urine labs. If the patient remains comfortable with mild symptoms and workup is negative, he should be suitable for outpatient management.   ----------------------------------------- 7:10 PM on 09/02/2016 ----------------------------------------- Patient has remained asymptomatic during his time in the ED. Labs including delta troponin are unremarkable. Tolerating oral intake. I'll discharge home with a  prescription for Zofran. Patient encouraged to follow up with his primary care doctor and cardiologist tomorrow. Return precautions given, specifically to return to the hospital immediately if he has any new or worsening symptoms at this point. No focal pain. Low suspicion for ACS PE dissection AAA perforation obstruction or biliary disease. Abdomen is benign.      ____________________________________________   FINAL CLINICAL IMPRESSION(S) / ED DIAGNOSES  Final diagnoses:  Non-intractable vomiting with nausea, unspecified vomiting type      New Prescriptions   ONDANSETRON (ZOFRAN ODT) 4 MG DISINTEGRATING TABLET    Take 1 tablet (4 mg total) by mouth every 8 (eight) hours as needed for nausea or vomiting.     Portions of this note were generated with dragon dictation software. Dictation errors may occur despite best attempts at proofreading.    Carrie Mew, MD 09/02/16 1911

## 2016-09-04 LAB — URINE CULTURE: Culture: <10000 — AB

## 2017-08-13 ENCOUNTER — Other Ambulatory Visit: Payer: Self-pay | Admitting: Student

## 2017-08-13 ENCOUNTER — Ambulatory Visit
Admission: RE | Admit: 2017-08-13 | Discharge: 2017-08-13 | Disposition: A | Discharge status: Home / Self Care | Payer: Medicare Other | Source: Ambulatory Visit | Attending: Student | Admitting: Student

## 2017-08-13 DIAGNOSIS — J984 Other disorders of lung: Secondary | ICD-10-CM | POA: Diagnosis not present

## 2017-08-13 DIAGNOSIS — R7611 Nonspecific reaction to tuberculin skin test without active tuberculosis: Secondary | ICD-10-CM | POA: Diagnosis not present

## 2017-08-13 DIAGNOSIS — I6522 Occlusion and stenosis of left carotid artery: Secondary | ICD-10-CM | POA: Diagnosis not present

## 2017-08-13 DIAGNOSIS — I700 Atherosclerosis of aorta: Secondary | ICD-10-CM | POA: Insufficient documentation

## 2017-08-13 DIAGNOSIS — Z227 Latent tuberculosis: Secondary | ICD-10-CM

## 2017-09-22 ENCOUNTER — Ambulatory Visit (INDEPENDENT_AMBULATORY_CARE_PROVIDER_SITE_OTHER): Payer: Medicare Other | Admitting: Vascular Surgery

## 2017-09-22 ENCOUNTER — Encounter (INDEPENDENT_AMBULATORY_CARE_PROVIDER_SITE_OTHER): Payer: Medicare Other

## 2017-09-24 ENCOUNTER — Ambulatory Visit (INDEPENDENT_AMBULATORY_CARE_PROVIDER_SITE_OTHER): Payer: Medicare Other | Admitting: Nurse Practitioner

## 2017-09-24 ENCOUNTER — Encounter (INDEPENDENT_AMBULATORY_CARE_PROVIDER_SITE_OTHER): Payer: Self-pay | Admitting: Nurse Practitioner

## 2017-09-24 ENCOUNTER — Other Ambulatory Visit (INDEPENDENT_AMBULATORY_CARE_PROVIDER_SITE_OTHER): Payer: Self-pay | Admitting: Vascular Surgery

## 2017-09-24 ENCOUNTER — Ambulatory Visit (INDEPENDENT_AMBULATORY_CARE_PROVIDER_SITE_OTHER): Payer: Medicare Other

## 2017-09-24 VITALS — BP 175/82 | HR 79 | Resp 16 | Ht 61.0 in | Wt 100.0 lb

## 2017-09-24 DIAGNOSIS — N186 End stage renal disease: Secondary | ICD-10-CM | POA: Diagnosis not present

## 2017-09-24 DIAGNOSIS — I10 Essential (primary) hypertension: Secondary | ICD-10-CM

## 2017-09-24 DIAGNOSIS — T829XXD Unspecified complication of cardiac and vascular prosthetic device, implant and graft, subsequent encounter: Secondary | ICD-10-CM

## 2017-09-24 DIAGNOSIS — N170 Acute kidney failure with tubular necrosis: Secondary | ICD-10-CM | POA: Diagnosis not present

## 2017-09-24 NOTE — Progress Notes (Signed)
Subjective:    Patient ID: Ralph Allison, male    DOB: 18-Nov-1950, 67 y.o.   MRN: [id number]  Chief Complaint  Patient presents with  . Follow-up    HPI  The patient returns to the office for followup of their dialysis access. The function of the access has been stable per the patient.  The patient denies increased bleeding time or increased recirculation. Patient denies difficulty with cannulation. The patient denies hand pain or other symptoms consistent with steal phenomena.  The patient endorses recent swelling of AVF and his hemodialysis center was concerned, therefore they requested that he be seen for an HDA.  He endorses having a history of heart transplantation, about 10 years ago and that his primary doctors are located at [facility].  The patient denies redness or swelling at the access site. The patient denies fever or chills at home or while on dialysis.  The patient denies amaurosis fugax or recent TIA symptoms. There are no recent neurological changes noted. The patient denies claudication symptoms or rest pain symptoms. The patient denies history of DVT, PE or superficial thrombophlebitis. The patient denies recent episodes of angina or shortness of breath.        Constitutional: [] Weight loss  [] Fever  [] Chills Cardiac: [] Chest pain   [] Chest pressure   [] Palpitations   [] Shortness of breath when laying flat   [] Shortness of breath with exertion. Vascular:  [] Pain in legs with walking   [] Pain in legs with standing  [] History of DVT   [] Phlebitis   [] Swelling in legs   [] Varicose veins   [] Non-healing ulcers +AVF Swelling Pulmonary:   [] Uses home oxygen   [] Productive cough   [] Hemoptysis   [] Wheeze  [] COPD   [] Asthma Neurologic:  [] Dizziness   [] Seizures   [] History of stroke   [] History of TIA  [] Aphasia   [] Vissual changes   [] Weakness or numbness in arm   [] Weakness or numbness in leg Musculoskeletal:   [] Joint swelling   [] Joint pain   [] Low back pain Hematologic:   [] Easy bruising  [] Easy bleeding   [] Hypercoagulable state   [] Anemic Gastrointestinal:  [] Diarrhea   [] Vomiting  [] Gastroesophageal reflux/heartburn   [] Difficulty swallowing. Genitourinary:  [] Chronic kidney disease   [] Difficult urination  [] Frequent urination   [] Blood in urine Skin:  [] Rashes   [] Ulcers  Psychological:  [] History of anxiety   []  History of major depression.     Objective:   Physical Exam  BP (!) 175/82 (BP Location: Right Arm)   Pulse 79   Resp 16   Ht 5\' 1"  (1.549 m)   Wt 100 lb (45.4 kg)   BMI 18.89 kg/m   Past Medical History:  Diagnosis Date  . Diabetes mellitus without complication ([place])   . Heart transplant recipient [facility]) 2009  . Renal disorder      Gen: WD/WN, NAD Head: [place]/AT, No temporalis wasting.  Ear/Nose/Throat: Hearing grossly intact, nares w/o erythema or drainage, poor dentition Eyes: PER, EOMI, sclera nonicteric.  Neck: Supple, no masses.  No bruit or JVD.  Pulmonary:  Good air movement, clear to auscultation bilaterally, no use of accessory muscles.  Cardiac: RRR, normal S1, S2, no Murmurs. Vascular: 5 mm aneurysmal fistula present on left forearm.  Strong thrill at proximal portion, however pulsatilenear  distal end Vessel Right Left  Radial 2+ palpable 2+palpable  Gastrointestinal: soft, non-distended. No guarding/no peritoneal signs.  Musculoskeletal: M/S 5/5 throughout.  No deformity or atrophy.  Neurologic: CN 2-12 intact. Pain and  light touch intact in extremities.  Symmetrical.  Speech is fluent. Motor exam as listed above. Psychiatric: Judgment intact, Mood & affect appropriate for pt's clinical situation. Dermatologic: Venous rashes no ulcers noted.  No changes consistent with cellulitis. Lymph : No Cervical lymphadenopathy, no lichenification or skin changes of chronic lymphedema.   Social History   Socioeconomic History  . Marital status: Married    Spouse name: Not on file  . Number of children: Not on file  . Years  of education: Not on file  . Highest education level: Not on file  Occupational History  . Not on file  Social Needs  . Financial resource strain: Not on file  . Food insecurity:    Worry: Not on file    Inability: Not on file  . Transportation needs:    Medical: Not on file    Non-medical: Not on file  Tobacco Use  . Smoking status: Never Smoker  . Smokeless tobacco: Never Used  Substance and Sexual Activity  . Alcohol use: No  . Drug use: Not on file  . Sexual activity: Not on file  Lifestyle  . Physical activity:    Days per week: Not on file    Minutes per session: Not on file  . Stress: Not on file  Relationships  . Social connections:    Talks on phone: Not on file    Gets together: Not on file    Attends religious service: Not on file    Active member of club or organization: Not on file    Attends meetings of clubs or organizations: Not on file    Relationship status: Not on file  . Intimate partner violence:    Fear of current or ex partner: Not on file    Emotionally abused: Not on file    Physically abused: Not on file    Forced sexual activity: Not on file  Other Topics Concern  . Not on file  Social History Narrative  . Not on file    Past Surgical History:  Procedure Laterality Date  . DIALYSIS/PERMA CATHETER REMOVAL N/A 06/03/2016   Procedure: Dialysis/Perma Catheter Removal;  Surgeon: Katha Cabal, MD;  Location: [place] CV LAB;  Service: Cardiovascular;  Laterality: N/A;  . HEART TRANSPLANT    . PERIPHERAL VASCULAR CATHETERIZATION N/A 02/08/2016   Procedure: Dialysis/Perma Catheter Insertion;  Surgeon: Katha Cabal, MD;  Location: [place] CV LAB;  Service: Cardiovascular;  Laterality: N/A;    Family History  Problem Relation Age of Onset  . Hypertension Father     Allergies  Allergen Reactions  . Heparin     Heart transplant  . Hydralazine Swelling       Assessment & Plan:   1. Complication of AV dialysis  fistula, subsequent encounter-Stable  Patient presented today as referral from his dialysis center for concern of fistula swelling.  Patient states that since that time past he has had no longer issues with swelling.  He states that his flows have been good and his fistula has been working fine.  He underwent an HDA today which showed the flow volume of 1128.  There were increased velocities of the distal brachial vein with a velocity of 488, the mid brachial vein has a velocity measuring 28 with previous stent, while the proximal brachial vein has a velocity of 169 also with a stent.  Based on these readings, as well as a history with stents it was felt that the best course  of action would be a fistulogram however the patient felt that his fistula was functioning normally and declined to undergo a fistulogram.  The patient understands the risk associated with bleeding, as well as what could happen if the fistula because is occluded.  The patient was advised to contact us should he feel his fistula is no longer receiving good flow or if he wanted to proceed with intervention.  2. Essential hypertension-Stable Managed by primary care physician, patient states under control.  3. Acute renal failure with tubular necrosis (HCC)-Stable See #1   Current Outpatient Medications on File Prior to Visit  Medication Sig Dispense Refill  . amLODipine (NORVASC) 5 MG tablet Take 5 mg by mouth 2 (two) times daily.    Marland Kitchen aspirin EC 81 MG tablet Take 81 mg by mouth every 3 (three) days.     . carvedilol (COREG) 25 MG tablet Take 25 mg by mouth every 12 (twelve) hours.     . mycophenolate (CELLCEPT) 500 MG tablet Take 500 mg by mouth 2 (two) times daily.     . ondansetron (ZOFRAN ODT) 4 MG disintegrating tablet Take 1 tablet (4 mg total) by mouth every 8 (eight) hours as needed for nausea or vomiting. 20 tablet 0  . pantoprazole (PROTONIX) 40 MG tablet Take 40 mg by mouth daily.     . pravastatin (PRAVACHOL) 40 MG  tablet Take 40 mg by mouth at bedtime.     . tacrolimus (PROGRAF) 0.5 MG capsule Take 1 mg by mouth 2 (two) times daily.      No current facility-administered medications on file prior to visit.     There are no Patient Instructions on file for this visit. No follow-ups on file.   Kris Hartmann, NP

## 2017-10-27 ENCOUNTER — Encounter: Payer: Self-pay | Admitting: *Deleted

## 2017-11-03 ENCOUNTER — Ambulatory Visit: Payer: Medicare Other | Admitting: Anesthesiology

## 2017-11-03 ENCOUNTER — Ambulatory Visit
Admission: RE | Admit: 2017-11-03 | Discharge: 2017-11-03 | Disposition: A | Discharge status: Home / Self Care | Payer: Medicare Other | Source: Ambulatory Visit | Attending: Ophthalmology | Admitting: Ophthalmology

## 2017-11-03 ENCOUNTER — Encounter
Admission: RE | Disposition: A | Discharge status: Home / Self Care | Payer: Self-pay | Source: Ambulatory Visit | Attending: Ophthalmology

## 2017-11-03 ENCOUNTER — Other Ambulatory Visit: Payer: Self-pay

## 2017-11-03 ENCOUNTER — Encounter: Payer: Self-pay | Admitting: Anesthesiology

## 2017-11-03 DIAGNOSIS — E7800 Pure hypercholesterolemia, unspecified: Secondary | ICD-10-CM | POA: Insufficient documentation

## 2017-11-03 DIAGNOSIS — Z79899 Other long term (current) drug therapy: Secondary | ICD-10-CM | POA: Insufficient documentation

## 2017-11-03 DIAGNOSIS — E1136 Type 2 diabetes mellitus with diabetic cataract: Secondary | ICD-10-CM | POA: Diagnosis not present

## 2017-11-03 DIAGNOSIS — Z888 Allergy status to other drugs, medicaments and biological substances status: Secondary | ICD-10-CM | POA: Diagnosis not present

## 2017-11-03 DIAGNOSIS — Z941 Heart transplant status: Secondary | ICD-10-CM | POA: Insufficient documentation

## 2017-11-03 DIAGNOSIS — I10 Essential (primary) hypertension: Secondary | ICD-10-CM | POA: Insufficient documentation

## 2017-11-03 DIAGNOSIS — M1990 Unspecified osteoarthritis, unspecified site: Secondary | ICD-10-CM | POA: Diagnosis not present

## 2017-11-03 DIAGNOSIS — H2511 Age-related nuclear cataract, right eye: Secondary | ICD-10-CM | POA: Diagnosis not present

## 2017-11-03 DIAGNOSIS — Z7982 Long term (current) use of aspirin: Secondary | ICD-10-CM | POA: Insufficient documentation

## 2017-11-03 DIAGNOSIS — I452 Bifascicular block: Secondary | ICD-10-CM | POA: Insufficient documentation

## 2017-11-03 HISTORY — DX: Unspecified osteoarthritis, unspecified site: M19.90

## 2017-11-03 HISTORY — DX: Essential (primary) hypertension: I10

## 2017-11-03 HISTORY — PX: CATARACT EXTRACTION W/PHACO: SHX586

## 2017-11-03 LAB — POCT I-STAT 4, (NA,K, GLUC, HGB,HCT)
Glucose, Bld: 89 mg/dL (ref 70–99)
HCT: 30 % — ABNORMAL LOW (ref 39.0–52.0)
Hemoglobin: 10.2 g/dL — ABNORMAL LOW (ref 13.0–17.0)
Potassium: 5 mmol/L (ref 3.5–5.1)
Sodium: 139 mmol/L (ref 135–145)

## 2017-11-03 SURGERY — PHACOEMULSIFICATION, CATARACT, WITH IOL INSERTION
Anesthesia: Monitor Anesthesia Care | Site: Eye | Laterality: Right | Wound class: "Clean "

## 2017-11-03 MED ORDER — MOXIFLOXACIN HCL 0.5 % OP SOLN
OPHTHALMIC | Status: AC
  Filled 2017-11-03: qty 3

## 2017-11-03 MED ORDER — MOXIFLOXACIN HCL 0.5 % OP SOLN: 1.0000 [drp] | OPHTHALMIC | Status: DC | PRN

## 2017-11-03 MED ORDER — MIDAZOLAM HCL 2 MG/2ML IJ SOLN
INTRAMUSCULAR | Status: AC
  Filled 2017-11-03: qty 2

## 2017-11-03 MED ORDER — NA CHONDROIT SULF-NA HYALURON 40-17 MG/ML IO SOLN
INTRAOCULAR | Status: DC | PRN
  Administered 2017-11-03: 1 mL via INTRAOCULAR

## 2017-11-03 MED ORDER — ARMC OPHTHALMIC DILATING DROPS
OPHTHALMIC | Status: AC
  Administered 2017-11-03: 1 via OPHTHALMIC
  Filled 2017-11-03: qty 0.5

## 2017-11-03 MED ORDER — ARMC OPHTHALMIC DILATING DROPS
1.0000 "application " | OPHTHALMIC | Status: AC
  Administered 2017-11-03 (×3): 1 via OPHTHALMIC

## 2017-11-03 MED ORDER — EPINEPHRINE PF 1 MG/ML IJ SOLN
INTRAOCULAR | Status: DC | PRN
  Administered 2017-11-03: 1 mL via OPHTHALMIC

## 2017-11-03 MED ORDER — MOXIFLOXACIN HCL 0.5 % OP SOLN
OPHTHALMIC | Status: DC | PRN
  Administered 2017-11-03: .2 mL via OPHTHALMIC

## 2017-11-03 MED ORDER — MIDAZOLAM HCL 2 MG/2ML IJ SOLN
INTRAMUSCULAR | Status: DC | PRN
  Administered 2017-11-03 (×2): 1 mg via INTRAVENOUS

## 2017-11-03 MED ORDER — LIDOCAINE HCL (PF) 4 % IJ SOLN
INTRAOCULAR | Status: DC | PRN
  Administered 2017-11-03: 2 mL via OPHTHALMIC

## 2017-11-03 MED ORDER — CARBACHOL 0.01 % IO SOLN
INTRAOCULAR | Status: DC | PRN
  Administered 2017-11-03: .5 mL via INTRAOCULAR

## 2017-11-03 MED ORDER — SODIUM CHLORIDE 0.9 % IV SOLN
INTRAVENOUS | Status: DC
  Administered 2017-11-03: 10:00:00 via INTRAVENOUS

## 2017-11-03 MED ORDER — POVIDONE-IODINE 5 % OP SOLN
OPHTHALMIC | Status: DC | PRN
  Administered 2017-11-03: 1 via OPHTHALMIC

## 2017-11-03 SURGICAL SUPPLY — 16 items
GLOVE BIO SURGEON STRL SZ8 (GLOVE) ×2 IMPLANT
GLOVE BIOGEL M 6.5 STRL (GLOVE) ×2 IMPLANT
GLOVE SURG LX 8.0 MICRO (GLOVE) ×1
GLOVE SURG LX STRL 8.0 MICRO (GLOVE) ×1 IMPLANT
GOWN STRL REUS W/ TWL LRG LVL3 (GOWN DISPOSABLE) ×2 IMPLANT
GOWN STRL REUS W/TWL LRG LVL3 (GOWN DISPOSABLE) ×2
LABEL CATARACT MEDS ST (LABEL) ×2 IMPLANT
LENS IOL TECNIS ITEC 14.5 (Intraocular Lens) ×1 IMPLANT
PACK CATARACT (MISCELLANEOUS) ×2 IMPLANT
PACK CATARACT BRASINGTON LX (MISCELLANEOUS) ×2 IMPLANT
PACK EYE AFTER SURG (MISCELLANEOUS) ×2 IMPLANT
SOL BSS BAG (MISCELLANEOUS) ×2
SOLUTION BSS BAG (MISCELLANEOUS) ×1 IMPLANT
SYR 5ML LL (SYRINGE) ×2 IMPLANT
WATER STERILE IRR 250ML POUR (IV SOLUTION) ×2 IMPLANT
WIPE NON LINTING 3.25X3.25 (MISCELLANEOUS) ×2 IMPLANT

## 2017-11-03 NOTE — Anesthesia Post-op Follow-up Note (Signed)
Anesthesia QCDR form completed.        

## 2017-11-03 NOTE — Anesthesia Postprocedure Evaluation (Signed)
Anesthesia Post Note  Patient: Ralph Allison  Procedure(s) Performed: CATARACT EXTRACTION PHACO AND INTRAOCULAR LENS PLACEMENT (IOC) (Right Eye)  Patient location during evaluation: Short Stay Anesthesia Type: MAC Level of consciousness: awake and alert, oriented and patient cooperative Pain management: pain level controlled Vital Signs Assessment: post-procedure vital signs reviewed and stable Respiratory status: spontaneous breathing, nonlabored ventilation and respiratory function stable Cardiovascular status: blood pressure returned to baseline and stable Postop Assessment: no headache and no apparent nausea or vomiting Anesthetic complications: no     Last Vitals:  Vitals:   11/03/17 0945 11/03/17 1040  BP: (!) 164/84 (!) 179/79  Pulse: 72 69  Resp: 17 16  Temp: 36.8 C (!) 36.4 C  SpO2: 100% 100%    Last Pain:  Vitals:   11/03/17 1040  TempSrc: Temporal  PainSc: 0-No pain                 Eben Burow

## 2017-11-03 NOTE — Op Note (Signed)
PREOPERATIVE DIAGNOSIS:  Nuclear sclerotic cataract of the right eye.   POSTOPERATIVE DIAGNOSIS:  nuclear sclerotic cataract right eye   OPERATIVE PROCEDURE: Procedure(s): CATARACT EXTRACTION PHACO AND INTRAOCULAR LENS PLACEMENT (IOC)   SURGEON:  Birder Robson, MD.   ANESTHESIA:  Anesthesiologist: Gunnar Bulla, MD CRNA: Eben Burow, CRNA  1.      Managed anesthesia care. 2.      0.28ml of Shugarcaine was instilled in the eye following the paracentesis.   COMPLICATIONS:  None.   TECHNIQUE:   Stop and chop   DESCRIPTION OF PROCEDURE:  The patient was examined and consented in the preoperative holding area where the aforementioned topical anesthesia was applied to the right eye and then brought back to the Operating Room where the right eye was prepped and draped in the usual sterile ophthalmic fashion and a lid speculum was placed. A paracentesis was created with the side port blade and the anterior chamber was filled with viscoelastic. A near clear corneal incision was performed with the steel keratome. A continuous curvilinear capsulorrhexis was performed with a cystotome followed by the capsulorrhexis forceps. Hydrodissection and hydrodelineation were carried out with BSS on a blunt cannula. The lens was removed in a stop and chop  technique and the remaining cortical material was removed with the irrigation-aspiration handpiece. The capsular bag was inflated with viscoelastic and the Technis [id number]  lens was placed in the capsular bag without complication. The remaining viscoelastic was removed from the eye with the irrigation-aspiration handpiece. The wounds were hydrated. The anterior chamber was flushed with Miostat and the eye was inflated to physiologic pressure. 0.75ml of Vigamox was placed in the anterior chamber. The wounds were found to be water tight. The eye was dressed with Vigamox. The patient was given protective glasses to wear throughout the day and a shield with which to  sleep tonight. The patient was also given drops with which to begin a drop regimen today and will follow-up with me in one day. Implant Name Type Inv. Item Serial No. Manufacturer Lot No. LRB No. Used  LENS IOL DIOP 14.5 - [id number] Intraocular Lens LENS IOL DIOP 14.5 [id number]  Right 1   Procedure(s) with comments: CATARACT EXTRACTION PHACO AND INTRAOCULAR LENS PLACEMENT (IOC) (Right) - [place] 00:34 AP% 14.9 CDE 5.08 Fluid pack lot # [id number]  Electronically signed: Birder Robson 11/03/2017 10:38 AM

## 2017-11-03 NOTE — Anesthesia Preprocedure Evaluation (Addendum)
Anesthesia Evaluation  Patient identified by MRN, date of birth, ID band Patient awake    Reviewed: Allergy & Precautions, NPO status , Patient's Chart, lab work & pertinent test results, reviewed documented beta blocker date and time   Airway Mallampati: II  TM Distance: >3 FB     Dental  (+) Chipped, Missing   Pulmonary           Cardiovascular hypertension, Pt. on medications and Pt. on home beta blockers      Neuro/Psych    GI/Hepatic   Endo/Other  diabetes, Type 2  Renal/GU Renal disease     Musculoskeletal  (+) Arthritis ,   Abdominal   Peds  Hematology   Anesthesia Other Findings EKG reviewed Rbbb and LPHB, LVH. Heart transplant.  Reproductive/Obstetrics                           Anesthesia Physical Anesthesia Plan  ASA: III  Anesthesia Plan: MAC   Post-op Pain Management:    Induction:   PONV Risk Score and Plan:   Airway Management Planned:   Additional Equipment:   Intra-op Plan:   Post-operative Plan:   Informed Consent: I have reviewed the patients History and Physical, chart, labs and discussed the procedure including the risks, benefits and alternatives for the proposed anesthesia with the patient or authorized representative who has indicated his/her understanding and acceptance.     Plan Discussed with: CRNA  Anesthesia Plan Comments:         Anesthesia Quick Evaluation

## 2017-11-03 NOTE — Transfer of Care (Signed)
Immediate Anesthesia Transfer of Care Note  Patient: Ralph Allison  Procedure(s) Performed: CATARACT EXTRACTION PHACO AND INTRAOCULAR LENS PLACEMENT (IOC) (Right Eye)  Patient Location: Short Stay  Anesthesia Type:MAC  Level of Consciousness: awake, alert , oriented and patient cooperative  Airway & Oxygen Therapy: Patient Spontanous Breathing  Post-op Assessment: Report given to RN and Post -op Vital signs reviewed and stable  Post vital signs: Reviewed and stable  Last Vitals:  Vitals Value Taken Time  BP    Temp    Pulse    Resp    SpO2      Last Pain:  Vitals:   11/03/17 0945  TempSrc: Oral  PainSc: 0-No pain         Complications: No apparent anesthesia complications

## 2017-11-03 NOTE — H&P (Signed)
All labs reviewed. Abnormal studies sent to patients PCP when indicated.  Previous H&P reviewed, patient examined, there are NO CHANGES.  Ralph Fennell Porfilio9/10/201910:13 AM

## 2017-11-03 NOTE — Discharge Instructions (Addendum)
Eye Surgery Discharge Instructions  Expect mild scratchy sensation or mild soreness. DO NOT RUB YOUR EYE!  The day of surgery:  Minimal physical activity, but bed rest is not required  No reading, computer work, or close hand work  No bending, lifting, or straining.  May watch TV  For 24 hours:  No driving, legal decisions, or alcoholic beverages  Safety precautions  Eat anything you prefer: It is better to start with liquids, then soup then solid foods.  Solar shield eyeglasses should be worn for comfort in the sunlight/patch while sleeping  Resume all regular medications including aspirin or Coumadin if these were discontinued prior to surgery. You may shower, bathe, shave, or wash your hair. Tylenol may be taken for mild discomfort. FOLLOW DR. PORFILIO'S EYE DROP INSTRUCTION SHEET AS REVIEWED.  Call your doctor if you experience significant pain, nausea, or vomiting, fever > 101 or other signs of infection. [phone] or [phone] Specific instructions:  Follow-up Information    Birder Robson, MD Follow up.   Specialty:  Ophthalmology Why:  11/04/17 @ 1:35 pm  Contact information: [place] [address] [phone]

## 2017-11-04 ENCOUNTER — Encounter: Payer: Self-pay | Admitting: Ophthalmology

## 2017-12-23 ENCOUNTER — Encounter: Payer: Self-pay | Admitting: *Deleted

## 2017-12-29 ENCOUNTER — Ambulatory Visit
Admission: RE | Admit: 2017-12-29 | Discharge: 2017-12-29 | Disposition: A | Discharge status: Home / Self Care | Payer: Medicare Other | Source: Ambulatory Visit | Attending: Ophthalmology | Admitting: Ophthalmology

## 2017-12-29 ENCOUNTER — Ambulatory Visit: Payer: Medicare Other | Admitting: Anesthesiology

## 2017-12-29 ENCOUNTER — Encounter: Payer: Self-pay | Admitting: *Deleted

## 2017-12-29 ENCOUNTER — Other Ambulatory Visit: Payer: Self-pay

## 2017-12-29 ENCOUNTER — Encounter
Admission: RE | Disposition: A | Discharge status: Home / Self Care | Payer: Self-pay | Source: Ambulatory Visit | Attending: Ophthalmology

## 2017-12-29 DIAGNOSIS — Z79899 Other long term (current) drug therapy: Secondary | ICD-10-CM | POA: Insufficient documentation

## 2017-12-29 DIAGNOSIS — I10 Essential (primary) hypertension: Secondary | ICD-10-CM | POA: Insufficient documentation

## 2017-12-29 DIAGNOSIS — Z941 Heart transplant status: Secondary | ICD-10-CM | POA: Insufficient documentation

## 2017-12-29 DIAGNOSIS — T50905A Adverse effect of unspecified drugs, medicaments and biological substances, initial encounter: Secondary | ICD-10-CM | POA: Diagnosis not present

## 2017-12-29 DIAGNOSIS — Z992 Dependence on renal dialysis: Secondary | ICD-10-CM | POA: Insufficient documentation

## 2017-12-29 DIAGNOSIS — N186 End stage renal disease: Secondary | ICD-10-CM | POA: Diagnosis not present

## 2017-12-29 DIAGNOSIS — H2512 Age-related nuclear cataract, left eye: Secondary | ICD-10-CM | POA: Insufficient documentation

## 2017-12-29 HISTORY — PX: CATARACT EXTRACTION W/PHACO: SHX586

## 2017-12-29 LAB — POCT I-STAT 4, (NA,K, GLUC, HGB,HCT)
Glucose, Bld: 93 mg/dL (ref 70–99)
HEMATOCRIT: 36 % — AB (ref 39.0–52.0)
HEMOGLOBIN: 12.2 g/dL — AB (ref 13.0–17.0)
Potassium: 5 mmol/L (ref 3.5–5.1)
SODIUM: 138 mmol/L (ref 135–145)

## 2017-12-29 SURGERY — PHACOEMULSIFICATION, CATARACT, WITH IOL INSERTION
Anesthesia: Monitor Anesthesia Care | Site: Eye | Laterality: Left

## 2017-12-29 MED ORDER — EPINEPHRINE PF 1 MG/ML IJ SOLN
INTRAOCULAR | Status: DC | PRN
  Administered 2017-12-29: 1 mL via OPHTHALMIC

## 2017-12-29 MED ORDER — ARMC OPHTHALMIC DILATING DROPS
OPHTHALMIC | Status: AC
  Administered 2017-12-29: 1 via OPHTHALMIC
  Filled 2017-12-29: qty 0.5

## 2017-12-29 MED ORDER — NA CHONDROIT SULF-NA HYALURON 40-17 MG/ML IO SOLN
INTRAOCULAR | Status: AC
  Filled 2017-12-29: qty 1

## 2017-12-29 MED ORDER — MOXIFLOXACIN HCL 0.5 % OP SOLN
OPHTHALMIC | Status: DC | PRN
  Administered 2017-12-29: .2 mL via OPHTHALMIC

## 2017-12-29 MED ORDER — MIDAZOLAM HCL 2 MG/2ML IJ SOLN
INTRAMUSCULAR | Status: DC | PRN
  Administered 2017-12-29 (×2): 1 mg via INTRAVENOUS

## 2017-12-29 MED ORDER — LIDOCAINE HCL (PF) 4 % IJ SOLN
INTRAOCULAR | Status: DC | PRN
  Administered 2017-12-29: 2 mL via OPHTHALMIC

## 2017-12-29 MED ORDER — MIDAZOLAM HCL 2 MG/2ML IJ SOLN
INTRAMUSCULAR | Status: AC
  Filled 2017-12-29: qty 2

## 2017-12-29 MED ORDER — FENTANYL CITRATE (PF) 100 MCG/2ML IJ SOLN
INTRAMUSCULAR | Status: AC
  Filled 2017-12-29: qty 2

## 2017-12-29 MED ORDER — TETRACAINE HCL 0.5 % OP SOLN
1.0000 [drp] | OPHTHALMIC | Status: DC | PRN
  Administered 2017-12-29 (×2): 1 [drp] via OPHTHALMIC

## 2017-12-29 MED ORDER — POVIDONE-IODINE 5 % OP SOLN
OPHTHALMIC | Status: DC | PRN
  Administered 2017-12-29: 1 via OPHTHALMIC

## 2017-12-29 MED ORDER — NA CHONDROIT SULF-NA HYALURON 40-17 MG/ML IO SOLN
INTRAOCULAR | Status: DC | PRN
  Administered 2017-12-29: 1 mL via INTRAOCULAR

## 2017-12-29 MED ORDER — MOXIFLOXACIN HCL 0.5 % OP SOLN
OPHTHALMIC | Status: AC
  Filled 2017-12-29: qty 3

## 2017-12-29 MED ORDER — MOXIFLOXACIN HCL 0.5 % OP SOLN: 1.0000 [drp] | OPHTHALMIC | Status: DC | PRN

## 2017-12-29 MED ORDER — FENTANYL CITRATE (PF) 100 MCG/2ML IJ SOLN
INTRAMUSCULAR | Status: DC | PRN
  Administered 2017-12-29: 50 ug via INTRAVENOUS

## 2017-12-29 MED ORDER — POVIDONE-IODINE 5 % OP SOLN
OPHTHALMIC | Status: AC
  Filled 2017-12-29: qty 30

## 2017-12-29 MED ORDER — ARMC OPHTHALMIC DILATING DROPS
1.0000 "application " | OPHTHALMIC | Status: AC
  Administered 2017-12-29 (×2): 1 via OPHTHALMIC

## 2017-12-29 MED ORDER — CARBACHOL 0.01 % IO SOLN
INTRAOCULAR | Status: DC | PRN
  Administered 2017-12-29: .5 mL via INTRAOCULAR

## 2017-12-29 MED ORDER — EPINEPHRINE PF 1 MG/ML IJ SOLN
INTRAMUSCULAR | Status: AC
  Filled 2017-12-29: qty 1

## 2017-12-29 MED ORDER — LIDOCAINE HCL (PF) 4 % IJ SOLN
INTRAMUSCULAR | Status: AC
  Filled 2017-12-29: qty 5

## 2017-12-29 MED ORDER — SODIUM CHLORIDE 0.9 % IV SOLN
INTRAVENOUS | Status: DC
  Administered 2017-12-29: 11:00:00 via INTRAVENOUS

## 2017-12-29 MED ORDER — TETRACAINE HCL 0.5 % OP SOLN
OPHTHALMIC | Status: AC
  Administered 2017-12-29: 1 [drp] via OPHTHALMIC
  Filled 2017-12-29: qty 4

## 2017-12-29 SURGICAL SUPPLY — 16 items
GLOVE BIO SURGEON STRL SZ8 (GLOVE) ×2 IMPLANT
GLOVE BIOGEL M 6.5 STRL (GLOVE) ×2 IMPLANT
GLOVE SURG LX 8.0 MICRO (GLOVE) ×1
GLOVE SURG LX STRL 8.0 MICRO (GLOVE) ×1 IMPLANT
GOWN STRL REUS W/ TWL LRG LVL3 (GOWN DISPOSABLE) ×2 IMPLANT
GOWN STRL REUS W/TWL LRG LVL3 (GOWN DISPOSABLE) ×2
LABEL CATARACT MEDS ST (LABEL) ×2 IMPLANT
LENS IOL TECNIS ITEC 19.0 (Intraocular Lens) ×1 IMPLANT
PACK CATARACT (MISCELLANEOUS) ×2 IMPLANT
PACK CATARACT BRASINGTON LX (MISCELLANEOUS) ×2 IMPLANT
PACK EYE AFTER SURG (MISCELLANEOUS) ×2 IMPLANT
SOL BSS BAG (MISCELLANEOUS) ×2
SOLUTION BSS BAG (MISCELLANEOUS) ×1 IMPLANT
SYR 5ML LL (SYRINGE) ×2 IMPLANT
WATER STERILE IRR 250ML POUR (IV SOLUTION) ×2 IMPLANT
WIPE NON LINTING 3.25X3.25 (MISCELLANEOUS) ×2 IMPLANT

## 2017-12-29 NOTE — H&P (Signed)
All labs reviewed. Abnormal studies sent to patients PCP when indicated.  Previous H&P reviewed, patient examined, there are NO CHANGES.  Ralph Rodriges Porfilio11/5/201911:13 AM

## 2017-12-29 NOTE — Anesthesia Preprocedure Evaluation (Signed)
Anesthesia Evaluation  Patient identified by MRN, date of birth, ID band Patient awake    Reviewed: Allergy & Precautions, NPO status , Patient's Chart, lab work & pertinent test results  History of Anesthesia Complications Negative for: history of anesthetic complications  Airway Mallampati: II  TM Distance: >3 FB Neck ROM: Full    Dental no notable dental hx.    Pulmonary neg pulmonary ROS, neg sleep apnea, neg COPD,    breath sounds clear to auscultation- rhonchi (-) wheezing      Cardiovascular hypertension, Pt. on medications (-) CAD, (-) Past MI, (-) Cardiac Stents and (-) CABG  Rhythm:Regular Rate:Normal - Systolic murmurs and - Diastolic murmurs S/p heart transplant 2010    Neuro/Psych negative neurological ROS  negative psych ROS   GI/Hepatic negative GI ROS, Neg liver ROS,   Endo/Other  negative endocrine ROSneg diabetes  Renal/GU Dialysis and ESRFRenal disease     Musculoskeletal  (+) Arthritis ,   Abdominal (+) - obese,   Peds  Hematology negative hematology ROS (+)   Anesthesia Other Findings Past Medical History: No date: Arthritis 2010: Heart transplant recipient [facility]) No date: Hypertension No date: Renal disorder     Comment:  DIALYSIS M/W/F   Reproductive/Obstetrics                             Anesthesia Physical Anesthesia Plan  ASA: IV  Anesthesia Plan: MAC   Post-op Pain Management:    Induction: Intravenous  PONV Risk Score and Plan: 1 and Midazolam  Airway Management Planned: Natural Airway  Additional Equipment:   Intra-op Plan:   Post-operative Plan:   Informed Consent: I have reviewed the patients History and Physical, chart, labs and discussed the procedure including the risks, benefits and alternatives for the proposed anesthesia with the patient or authorized representative who has indicated his/her understanding and acceptance.     Plan  Discussed with: CRNA and Anesthesiologist  Anesthesia Plan Comments:         Anesthesia Quick Evaluation

## 2017-12-29 NOTE — Op Note (Signed)
PREOPERATIVE DIAGNOSIS:  Nuclear sclerotic cataract of the left eye.   POSTOPERATIVE DIAGNOSIS:  Nuclear sclerotic cataract of the left eye.   OPERATIVE PROCEDURE: Procedure(s): CATARACT EXTRACTION PHACO AND INTRAOCULAR LENS PLACEMENT (IOC)   SURGEON:  Birder Robson, MD.   ANESTHESIA:  No anesthesia staff entered.  1.      Managed anesthesia care. 2.     0.75ml of Shugarcaine was instilled following the paracentesis   COMPLICATIONS:  None.   TECHNIQUE:   Stop and chop   DESCRIPTION OF PROCEDURE:  The patient was examined and consented in the preoperative holding area where the aforementioned topical anesthesia was applied to the left eye and then brought back to the Operating Room where the left eye was prepped and draped in the usual sterile ophthalmic fashion and a lid speculum was placed. A paracentesis was created with the side port blade and the anterior chamber was filled with viscoelastic. A near clear corneal incision was performed with the steel keratome. A continuous curvilinear capsulorrhexis was performed with a cystotome followed by the capsulorrhexis forceps. Hydrodissection and hydrodelineation were carried out with BSS on a blunt cannula. The lens was removed in a stop and chop  technique and the remaining cortical material was removed with the irrigation-aspiration handpiece. The capsular bag was inflated with viscoelastic and the Technis [id number] lens was placed in the capsular bag without complication. The remaining viscoelastic was removed from the eye with the irrigation-aspiration handpiece. The wounds were hydrated. The anterior chamber was flushed with Miostat and the eye was inflated to physiologic pressure. 0.52ml Vigamox was placed in the anterior chamber. The wounds were found to be water tight. The eye was dressed with Vigamox. The patient was given protective glasses to wear throughout the day and a shield with which to sleep tonight. The patient was also given drops  with which to begin a drop regimen today and will follow-up with me in one day. Implant Name Type Inv. Item Serial No. Manufacturer Lot No. LRB No. Used  LENS IOL DIOP 19.0 - [id number] Intraocular Lens LENS IOL DIOP 19.0 [id number]  Left 1    Procedure(s) with comments: CATARACT EXTRACTION PHACO AND INTRAOCULAR LENS PLACEMENT (IOC) (Left) - [place]  00:30 CDE 5.27 fluid pack lot # [id number]  Electronically signed: Birder Robson 12/29/2017 11:42 AM

## 2017-12-29 NOTE — Transfer of Care (Signed)
Immediate Anesthesia Transfer of Care Note  Patient: Ralph Allison  Procedure(s) Performed: CATARACT EXTRACTION PHACO AND INTRAOCULAR LENS PLACEMENT ([place]) (Left Eye)  Patient Location: Short Stay  Anesthesia Type:MAC  Level of Consciousness: awake, alert  and oriented  Airway & Oxygen Therapy: Patient Spontanous Breathing  Post-op Assessment: Report given to RN and Post -op Vital signs reviewed and stable  Post vital signs: Reviewed and stable  Last Vitals:  Vitals Value Taken Time  BP 190/88 12/29/2017 11:45 AM  Temp 36.9 C 12/29/2017 11:44 AM  Pulse 76 12/29/2017 11:45 AM  Resp 18 12/29/2017 11:45 AM  SpO2 98 % 12/29/2017 11:45 AM    Last Pain:  Vitals:   12/29/17 1144  TempSrc: Oral  PainSc: 0-No pain         Complications: No apparent anesthesia complications

## 2017-12-29 NOTE — Anesthesia Post-op Follow-up Note (Signed)
Anesthesia QCDR form completed.        

## 2017-12-29 NOTE — Anesthesia Postprocedure Evaluation (Signed)
Anesthesia Post Note  Patient: Ralph Allison  Procedure(s) Performed: CATARACT EXTRACTION PHACO AND INTRAOCULAR LENS PLACEMENT ([place]) (Left Eye)  Patient location during evaluation: Short Stay Anesthesia Type: MAC Level of consciousness: awake and alert and oriented Pain management: pain level controlled Vital Signs Assessment: post-procedure vital signs reviewed and stable Respiratory status: spontaneous breathing Cardiovascular status: stable Postop Assessment: no headache and adequate PO intake Anesthetic complications: no     Last Vitals:  Vitals:   12/29/17 1144 12/29/17 1145  BP: (!) 159/84 (!) 190/88  Pulse: 69 76  Resp: 18 18  Temp: 36.9 C   SpO2: 100% 98%    Last Pain:  Vitals:   12/29/17 1144  TempSrc: Oral  PainSc: 0-No pain                 Lanora Manis

## 2017-12-29 NOTE — Discharge Instructions (Addendum)
Eye Surgery Discharge Instructions  Expect mild scratchy sensation or mild soreness. DO NOT RUB YOUR EYE!  The day of surgery:  Minimal physical activity, but bed rest is not required  No reading, computer work, or close hand work  No bending, lifting, or straining.  May watch TV  For 24 hours:  No driving, legal decisions, or alcoholic beverages  Safety precautions  Eat anything you prefer: It is better to start with liquids, then soup then solid foods.  Solar shield eyeglasses should be worn for comfort in the sunlight/patch while sleeping  Resume all regular medications including aspirin or Coumadin if these were discontinued prior to surgery. You may shower, bathe, shave, or wash your hair. Tylenol may be taken for mild discomfort. FOLLOW DR. PORFILIO'S EYE DROP INSTRUCTION SHEET AS REVIEWED.  Call your doctor if you experience significant pain, nausea, or vomiting, fever > 101 or other signs of infection. [phone] or [phone] Specific instructions:  Follow-up Information    Birder Robson, MD Follow up.   Specialty:  Ophthalmology Why:  12/30/17 @ 1:35 pm Contact information: [place] [address] [phone]

## 2018-06-22 ENCOUNTER — Ambulatory Visit: Admission: RE | Admit: 2018-06-22 | Payer: Medicare Other | Source: Home / Self Care | Admitting: Internal Medicine

## 2018-06-22 ENCOUNTER — Encounter: Admission: RE | Payer: Self-pay | Source: Home / Self Care

## 2018-06-22 SURGERY — COLONOSCOPY WITH PROPOFOL
Anesthesia: General

## 2018-08-27 ENCOUNTER — Other Ambulatory Visit
Admission: RE | Admit: 2018-08-27 | Discharge: 2018-08-27 | Disposition: A | Discharge status: Home / Self Care | Payer: Medicare Other | Source: Ambulatory Visit | Attending: Internal Medicine | Admitting: Internal Medicine

## 2018-08-27 ENCOUNTER — Other Ambulatory Visit: Payer: Self-pay

## 2018-08-27 DIAGNOSIS — Z01812 Encounter for preprocedural laboratory examination: Secondary | ICD-10-CM | POA: Diagnosis present

## 2018-08-27 DIAGNOSIS — Z1159 Encounter for screening for other viral diseases: Secondary | ICD-10-CM | POA: Insufficient documentation

## 2018-08-27 LAB — SARS CORONAVIRUS 2 (TAT 6-24 HRS): SARS Coronavirus 2: NEGATIVE

## 2018-08-31 ENCOUNTER — Encounter: Payer: Self-pay | Admitting: *Deleted

## 2018-09-01 ENCOUNTER — Ambulatory Visit: Payer: Medicare Other | Admitting: Certified Registered"

## 2018-09-01 ENCOUNTER — Other Ambulatory Visit: Payer: Self-pay

## 2018-09-01 ENCOUNTER — Encounter
Admission: RE | Disposition: A | Discharge status: Home / Self Care | Payer: Self-pay | Source: Home / Self Care | Attending: Internal Medicine

## 2018-09-01 ENCOUNTER — Encounter: Payer: Self-pay | Admitting: *Deleted

## 2018-09-01 ENCOUNTER — Inpatient Hospital Stay
Admission: RE | Admit: 2018-09-01 | Discharge: 2018-09-02 | DRG: 640 | Disposition: A | Discharge status: Home / Self Care | Payer: Medicare Other | Attending: Internal Medicine | Admitting: Internal Medicine

## 2018-09-01 DIAGNOSIS — N2581 Secondary hyperparathyroidism of renal origin: Secondary | ICD-10-CM | POA: Diagnosis present

## 2018-09-01 DIAGNOSIS — Z9841 Cataract extraction status, right eye: Secondary | ICD-10-CM

## 2018-09-01 DIAGNOSIS — N186 End stage renal disease: Secondary | ICD-10-CM | POA: Diagnosis present

## 2018-09-01 DIAGNOSIS — R195 Other fecal abnormalities: Secondary | ICD-10-CM | POA: Diagnosis present

## 2018-09-01 DIAGNOSIS — E785 Hyperlipidemia, unspecified: Secondary | ICD-10-CM | POA: Diagnosis present

## 2018-09-01 DIAGNOSIS — K222 Esophageal obstruction: Secondary | ICD-10-CM | POA: Diagnosis present

## 2018-09-01 DIAGNOSIS — Z961 Presence of intraocular lens: Secondary | ICD-10-CM | POA: Diagnosis present

## 2018-09-01 DIAGNOSIS — Z79899 Other long term (current) drug therapy: Secondary | ICD-10-CM

## 2018-09-01 DIAGNOSIS — Z888 Allergy status to other drugs, medicaments and biological substances status: Secondary | ICD-10-CM | POA: Diagnosis not present

## 2018-09-01 DIAGNOSIS — Z7982 Long term (current) use of aspirin: Secondary | ICD-10-CM

## 2018-09-01 DIAGNOSIS — K529 Noninfective gastroenteritis and colitis, unspecified: Secondary | ICD-10-CM | POA: Diagnosis present

## 2018-09-01 DIAGNOSIS — K640 First degree hemorrhoids: Secondary | ICD-10-CM | POA: Diagnosis present

## 2018-09-01 DIAGNOSIS — I120 Hypertensive chronic kidney disease with stage 5 chronic kidney disease or end stage renal disease: Secondary | ICD-10-CM | POA: Diagnosis present

## 2018-09-01 DIAGNOSIS — K449 Diaphragmatic hernia without obstruction or gangrene: Secondary | ICD-10-CM | POA: Diagnosis present

## 2018-09-01 DIAGNOSIS — Z9842 Cataract extraction status, left eye: Secondary | ICD-10-CM

## 2018-09-01 DIAGNOSIS — E875 Hyperkalemia: Secondary | ICD-10-CM | POA: Diagnosis present

## 2018-09-01 DIAGNOSIS — D631 Anemia in chronic kidney disease: Secondary | ICD-10-CM | POA: Diagnosis present

## 2018-09-01 DIAGNOSIS — D500 Iron deficiency anemia secondary to blood loss (chronic): Secondary | ICD-10-CM | POA: Diagnosis present

## 2018-09-01 DIAGNOSIS — Z992 Dependence on renal dialysis: Secondary | ICD-10-CM

## 2018-09-01 DIAGNOSIS — Z941 Heart transplant status: Secondary | ICD-10-CM | POA: Diagnosis not present

## 2018-09-01 DIAGNOSIS — Z8249 Family history of ischemic heart disease and other diseases of the circulatory system: Secondary | ICD-10-CM

## 2018-09-01 DIAGNOSIS — Z1159 Encounter for screening for other viral diseases: Secondary | ICD-10-CM

## 2018-09-01 LAB — CBC WITH DIFFERENTIAL/PLATELET
Abs Immature Granulocytes: 0.03 10*3/uL (ref 0.00–0.07)
Basophils Absolute: 0 10*3/uL (ref 0.0–0.1)
Basophils Relative: 0 %
Eosinophils Absolute: 0 10*3/uL (ref 0.0–0.5)
Eosinophils Relative: 1 %
HCT: 42.2 % (ref 39.0–52.0)
Hemoglobin: 12.8 g/dL — ABNORMAL LOW (ref 13.0–17.0)
Immature Granulocytes: 1 %
Lymphocytes Relative: 14 %
Lymphs Abs: 0.8 10*3/uL (ref 0.7–4.0)
MCH: 28.4 pg (ref 26.0–34.0)
MCHC: 30.3 g/dL (ref 30.0–36.0)
MCV: 93.6 fL (ref 80.0–100.0)
Monocytes Absolute: 0.3 10*3/uL (ref 0.1–1.0)
Monocytes Relative: 4 %
Neutro Abs: 4.8 10*3/uL (ref 1.7–7.7)
Neutrophils Relative %: 80 %
Platelets: 158 10*3/uL (ref 150–400)
RBC: 4.51 MIL/uL (ref 4.22–5.81)
RDW: 14 % (ref 11.5–15.5)
WBC: 5.9 10*3/uL (ref 4.0–10.5)
nRBC: 0 % (ref 0.0–0.2)

## 2018-09-01 LAB — COMPREHENSIVE METABOLIC PANEL
ALT: 8 U/L (ref 0–44)
AST: 19 U/L (ref 15–41)
Albumin: 3.8 g/dL (ref 3.5–5.0)
Alkaline Phosphatase: 83 U/L (ref 38–126)
Anion gap: 14 (ref 5–15)
BUN: 46 mg/dL — ABNORMAL HIGH (ref 8–23)
CO2: 20 mmol/L — ABNORMAL LOW (ref 22–32)
Calcium: 8.7 mg/dL — ABNORMAL LOW (ref 8.9–10.3)
Chloride: 96 mmol/L — ABNORMAL LOW (ref 98–111)
Creatinine, Ser: 7.64 mg/dL — ABNORMAL HIGH (ref 0.61–1.24)
GFR calc Af Amer: 8 mL/min — ABNORMAL LOW (ref >60)
GFR calc non Af Amer: 7 mL/min — ABNORMAL LOW (ref >60)
Glucose, Bld: 98 mg/dL (ref 70–99)
Potassium: 5.6 mmol/L — ABNORMAL HIGH (ref 3.5–5.1)
Sodium: 130 mmol/L — ABNORMAL LOW (ref 135–145)
Total Bilirubin: 0.5 mg/dL (ref 0.3–1.2)
Total Protein: 6.2 g/dL — ABNORMAL LOW (ref 6.5–8.1)

## 2018-09-01 LAB — POTASSIUM: Potassium: 6.6 mmol/L (ref 3.5–5.1)

## 2018-09-01 LAB — MRSA PCR SCREENING: MRSA by PCR: NEGATIVE

## 2018-09-01 SURGERY — COLONOSCOPY WITH PROPOFOL
Anesthesia: General

## 2018-09-01 MED ORDER — TACROLIMUS 1 MG PO CAPS
1.0000 mg | ORAL_CAPSULE | Freq: Two times a day (BID) | ORAL | Status: DC
  Administered 2018-09-02: 1 mg via ORAL
  Filled 2018-09-01 (×3): qty 1

## 2018-09-01 MED ORDER — LABETALOL HCL 5 MG/ML IV SOLN
10.0000 mg | INTRAVENOUS | Status: DC | PRN
  Administered 2018-09-01: 10 mg via INTRAVENOUS
  Filled 2018-09-01: qty 4

## 2018-09-01 MED ORDER — LIDOCAINE HCL (PF) 2 % IJ SOLN
INTRAMUSCULAR | Status: AC
  Filled 2018-09-01: qty 10

## 2018-09-01 MED ORDER — AMLODIPINE BESYLATE 5 MG PO TABS
5.0000 mg | ORAL_TABLET | Freq: Two times a day (BID) | ORAL | Status: DC
  Administered 2018-09-02: 5 mg via ORAL
  Filled 2018-09-01: qty 1

## 2018-09-01 MED ORDER — VITAMIN B-12 1000 MCG PO TABS: 1000.0000 ug | ORAL_TABLET | Freq: Every day | ORAL | Status: DC

## 2018-09-01 MED ORDER — PRAVASTATIN SODIUM 20 MG PO TABS: 20.0000 mg | ORAL_TABLET | Freq: Every day | ORAL | Status: DC

## 2018-09-01 MED ORDER — PANTOPRAZOLE SODIUM 40 MG PO TBEC
40.0000 mg | DELAYED_RELEASE_TABLET | Freq: Every evening | ORAL | Status: DC
  Filled 2018-09-01: qty 1

## 2018-09-01 MED ORDER — LIDOCAINE-PRILOCAINE 2.5-2.5 % EX CREA
1.0000 "application " | TOPICAL_CREAM | CUTANEOUS | Status: DC | PRN
  Filled 2018-09-01: qty 5

## 2018-09-01 MED ORDER — MYCOPHENOLATE MOFETIL 250 MG PO CAPS
500.0000 mg | ORAL_CAPSULE | Freq: Two times a day (BID) | ORAL | Status: DC
  Administered 2018-09-02: 500 mg via ORAL
  Filled 2018-09-01 (×3): qty 2

## 2018-09-01 MED ORDER — PROPOFOL 10 MG/ML IV BOLUS
INTRAVENOUS | Status: AC
  Filled 2018-09-01: qty 40

## 2018-09-01 MED ORDER — RENA-VITE PO TABS: 1.0000 | ORAL_TABLET | Freq: Every day | ORAL | Status: DC

## 2018-09-01 MED ORDER — CARVEDILOL 25 MG PO TABS
25.0000 mg | ORAL_TABLET | Freq: Two times a day (BID) | ORAL | Status: DC
  Administered 2018-09-02 (×2): 25 mg via ORAL
  Filled 2018-09-01 (×2): qty 1

## 2018-09-01 MED ORDER — SODIUM CHLORIDE 0.9 % IV SOLN
INTRAVENOUS | Status: DC
  Administered 2018-09-01: 13:00:00 via INTRAVENOUS

## 2018-09-01 NOTE — Interval H&P Note (Signed)
History and Physical Interval Note:  09/01/2018 12:54 PM  Ralph Allison  has presented today for surgery, with the diagnosis of IDA  HEME STOOL.  The various methods of treatment have been discussed with the patient and family. After consideration of risks, benefits and other options for treatment, the patient has consented to  Procedure(s): COLONOSCOPY WITH PROPOFOL (N/A) ESOPHAGOGASTRODUODENOSCOPY (EGD) WITH PROPOFOL (N/A) as a surgical intervention.  The patient's history has been reviewed, patient examined, no change in status, stable for surgery.  I have reviewed the patient's chart and labs.  Questions were answered to the patient's satisfaction.     [place], [place]

## 2018-09-01 NOTE — Progress Notes (Signed)
[facility]  ROUNDING NOTE   Subjective:   [name] admitted to [facility] for hyperkalemia. Last hemodialysis was Monday. He was scheduled for colonoscopy today and reschedule his hemodialysis for tomorrow. However now with hyperkalemia and endoscopy postponed until tomorrow.   Over the phone, wife admits patient has been eating tomatoes. However patient has no eaten in over 1 day.   Objective:  Vital signs in last 24 hours:  Temp:  [96.4 F (35.8 C)] 96.4 F (35.8 C) (07/08 1254) Pulse Rate:  [72] 72 (07/08 1254) Resp:  [18] 18 (07/08 1254) BP: (197)/(90) 197/90 (07/08 1254) SpO2:  [100 %] 100 % (07/08 1254) Weight:  [44 kg] 44 kg (07/08 1254)  Weight change:  Filed Weights   09/01/18 1254  Weight: 44 kg    Intake/Output: No intake/output data recorded.   Intake/Output this shift:  No intake/output data recorded.  Physical Exam: General: NAD,   Head: Normocephalic, atraumatic. Moist oral mucosal membranes  Eyes: Anicteric, PERRL  Neck: Supple, trachea midline  Lungs:  Clear to auscultation  Heart: Regular rate and rhythm  Abdomen:  Soft, nontender,   Extremities:  no peripheral edema.  Neurologic: Nonfocal, moving all four extremities  Skin: No lesions  Access: Right AVF    Basic Metabolic Panel: Recent Labs  Lab 09/01/18 1342  K 6.6*    Liver Function Tests: No results for input(s): AST, ALT, ALKPHOS, BILITOT, PROT, ALBUMIN in the last 168 hours. No results for input(s): LIPASE, AMYLASE in the last 168 hours. No results for input(s): AMMONIA in the last 168 hours.  CBC: No results for input(s): WBC, NEUTROABS, HGB, HCT, MCV, PLT in the last 168 hours.  Cardiac Enzymes: No results for input(s): CKTOTAL, CKMB, CKMBINDEX, TROPONINI in the last 168 hours.  BNP: Invalid input(s): POCBNP  CBG: No results for input(s): GLUCAP in the last 168 hours.  Microbiology: Results for orders placed or performed during the hospital encounter  of 08/27/18  SARS Coronavirus 2 (Performed in [facility] [place] hospital lab)     Status: None   Collection Time: 08/27/18  9:57 AM   Specimen: Nasal Swab  Result Value Ref Range Status   SARS Coronavirus 2 NEGATIVE NEGATIVE Final    Comment: (NOTE) SARS-CoV-2 target nucleic acids are NOT DETECTED. The SARS-CoV-2 RNA is generally detectable in upper and lower respiratory specimens during the acute phase of infection. Negative results do not preclude SARS-CoV-2 infection, do not rule out co-infections with other pathogens, and should not be used as the sole basis for treatment or other patient management decisions. Negative results must be combined with clinical observations, patient history, and epidemiological information. The expected result is Negative. Fact Sheet for Patients: SugarRoll.be Fact Sheet for Healthcare Providers: https://www.woods-mathews.com/ This test is not yet approved or cleared by the [place] FDA and  has been authorized for detection and/or diagnosis of SARS-CoV-2 by FDA under an Emergency Use Authorization (EUA). This EUA will remain  in effect (meaning this test can be used) for the duration of the COVID-19 declaration under Section 56 4(b)(1) of the Act, 21 U.S.C. section 360bbb-3(b)(1), unless the authorization is terminated or revoked sooner. Performed at [facility], [place] [address], [place], [place] [address]     Coagulation Studies: No results for input(s): LABPROT, INR in the last 72 hours.  Urinalysis: No results for input(s): COLORURINE, LABSPEC, PHURINE, GLUCOSEU, HGBUR, BILIRUBINUR, KETONESUR, PROTEINUR, UROBILINOGEN, NITRITE, LEUKOCYTESUR in the last 72 hours.  Invalid input(s): APPERANCEUR  Imaging: No results found.   Medications:    . amLODipine  5 mg Oral BID  . carvedilol  25 mg Oral Q12H  . [START ON 09/02/2018] multivitamin  1 tablet Oral Daily  . mycophenolate  500 mg Oral  BID  . pantoprazole  40 mg Oral QPM  . pravastatin  20 mg Oral QHS  . tacrolimus  1 mg Oral BID  . [START ON 09/02/2018] vitamin B-12  1,000 mcg Oral Daily   labetalol, lidocaine-prilocaine  Assessment/ Plan:  [name] is a 68 y.o. [place] male with end stage renal disease on hemodialysis, status post heart transplant, hypertension who is admitted to [facility] on 09/01/2018 for Hyperkalemia [E87.5]  1. Hyperkalemia: with end stage renal disease. Last dialysis was Monday.  Seems patient's potassium has been trending up with labs on 7/1 with potassium of 5.8. Patient's wife reports that he is eating tomatoes.  - Hemodialysis scheduled for tonight. Then resume MWF schedule. Orders prepared.   2. Hypertension: home regimen of amlodipine and carvedilol.   3. Anemia of chronic kidney disease: hemoglobin from 7/1 was 11.5 - [facility] as outpatient.   4. Secondary Hyperparathyroidism: PTH 255 on 7/1. Phosphorus and calcium at goal. Currently not on binders.   5. Heart transplant - tacrolimus   LOS: 0 Ralph Allison 7/8/20205:02 PM

## 2018-09-01 NOTE — Anesthesia Preprocedure Evaluation (Signed)
Anesthesia Evaluation  Patient identified by MRN, date of birth, ID band Patient awake    Reviewed: Allergy & Precautions, H&P , NPO status , Patient's Chart, lab work & pertinent test results  Airway Mallampati: II  TM Distance: >3 FB Neck ROM: limited    Dental  (+) Chipped, Poor Dentition, Missing   Pulmonary neg shortness of breath,           Cardiovascular Exercise Tolerance: Good hypertension, (-) angina(-) Past MI and (-) DOE negative cardio ROS       Neuro/Psych negative neurological ROS  negative psych ROS   GI/Hepatic negative GI ROS, Neg liver ROS, neg GERD  ,  Endo/Other  negative endocrine ROS  Renal/GU DialysisRenal disease  negative genitourinary   Musculoskeletal  (+) Arthritis ,   Abdominal   Peds  Hematology negative hematology ROS (+)   Anesthesia Other Findings Past Medical History: No date: Arthritis 2010: Heart transplant recipient [facility]) No date: Hypertension No date: Renal disorder     Comment:  DIALYSIS M/W/F  Past Surgical History: No date: AV FISTULA PLACEMENT; Left     Comment:  ARM 11/03/2017: CATARACT EXTRACTION W/PHACO; Right     Comment:  Procedure: CATARACT EXTRACTION PHACO AND INTRAOCULAR               LENS PLACEMENT (IOC);  Surgeon: Birder Robson, MD;                Location: [facility] ORS;  Service: Ophthalmology;  Laterality:              Right;  [place] 00:34 AP% 14.9 CDE 5.08 Fluid pack lot #               [id number] 12/29/2017: CATARACT EXTRACTION W/PHACO; Left     Comment:  Procedure: CATARACT EXTRACTION PHACO AND INTRAOCULAR               LENS PLACEMENT (IOC);  Surgeon: Birder Robson, MD;                Location: [facility] ORS;  Service: Ophthalmology;  Laterality:              Left;  [place]  00:30 CDE 5.27 fluid pack lot # [id number] 06/03/2016: DIALYSIS/PERMA CATHETER REMOVAL; N/A     Comment:  Procedure: Dialysis/Perma Catheter Removal;  Surgeon:               Katha Cabal, MD;  Location: [place] [facility];                Service: Cardiovascular;  Laterality: N/A; No date: EYE SURGERY No date: HEART TRANSPLANT 02/08/2016: PERIPHERAL VASCULAR CATHETERIZATION; N/A     Comment:  Procedure: Dialysis/Perma Catheter Insertion;  Surgeon:               Katha Cabal, MD;  Location: [place] [facility];                Service: Cardiovascular;  Laterality: N/A;  BMI    Body Mass Index: 18.33 kg/m      Reproductive/Obstetrics                             Anesthesia Physical Anesthesia Plan  ASA: IV  Anesthesia Plan: General   Post-op Pain Management:    Induction: Intravenous  PONV Risk Score and Plan: Propofol infusion and TIVA  Airway Management Planned: Natural Airway and Nasal Cannula  Additional Equipment:   Intra-op Plan:   Post-operative Plan:   Informed Consent: I have reviewed the patients History and Physical, chart, labs and discussed the procedure including the risks, benefits and alternatives for the proposed anesthesia with the patient or authorized representative who has indicated his/her understanding and acceptance.     Dental Advisory Given  Plan Discussed with: Anesthesiologist, CRNA and Surgeon  Anesthesia Plan Comments: (Patient consented for risks of anesthesia including but not limited to:  - adverse reactions to medications - risk of intubation if required - damage to teeth, lips or other oral mucosa - sore throat or hoarseness - Damage to heart, brain, lungs or loss of life  Patient voiced understanding.)        Anesthesia Quick Evaluation

## 2018-09-01 NOTE — Progress Notes (Signed)
Pre HD Assessment    09/01/18 2100  Neurological  Level of Consciousness Alert  Orientation Level Oriented X4  Respiratory  Respiratory Pattern Regular;Unlabored  Chest Assessment Chest expansion symmetrical  Bilateral Breath Sounds Clear;Diminished  Cough None  Cardiac  Pulse Regular  Heart Sounds S1, S2  ECG Monitor Yes  Vascular  R Radial Pulse +2  L Radial Pulse +2  Edema Generalized  Psychosocial  Psychosocial (WDL) WDL

## 2018-09-01 NOTE — H&P (Addendum)
[facility] [place] NAME: Ralph Allison    MR#:  [id number]  DATE OF BIRTH:  [birth year]  DATE OF ADMISSION:  09/01/2018  PRIMARY CARE PHYSICIAN: Diamond Nickel, MD   REQUESTING/REFERRING PHYSICIAN: Olean Ree MD  CHIEF COMPLAINT:  No chief complaint on file. Hyperkalemia  HISTORY OF PRESENT ILLNESS:  68 y.o. 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  Per GI team, patient was noted by his renal team to have lower than normal hemoglobin over the last several months.  He presented today for elective EGD and colonoscopy to evaluate anemia and heme positive stool but was noted to have hyperkalemia therefore the procedure was held. On exam patient denies any symptoms, he report that he feels weak from not eating the whole day due to bowel prep for procedure today.  Hospitalist has been been contacted to admit patient for hemodialysis and hyperkalemia management.  PAST MEDICAL HISTORY:   Past Medical History:  Diagnosis Date  . Arthritis   . Heart transplant recipient [facility]) 2010  . Hypertension   . Renal disorder    DIALYSIS M/W/F    PAST SURGICAL HISTORY:   Past Surgical History:  Procedure Laterality Date  . AV FISTULA PLACEMENT Left    ARM  . CATARACT EXTRACTION W/PHACO Right 11/03/2017   Procedure: CATARACT EXTRACTION PHACO AND INTRAOCULAR LENS PLACEMENT (IOC);  Surgeon: Birder Robson, MD;  Location: [facility] ORS;  Service: Ophthalmology;  Laterality: Right;  [place] 00:34 AP% 14.9 CDE 5.08 Fluid pack lot # [id number]  . CATARACT EXTRACTION W/PHACO Left 12/29/2017   Procedure: CATARACT EXTRACTION PHACO AND INTRAOCULAR LENS PLACEMENT ([place]);  Surgeon: Birder Robson, MD;  Location: [facility] ORS;  Service: Ophthalmology;  Laterality: Left;  [place]  00:30 CDE 5.27 fluid pack  lot # [id number]  . DIALYSIS/PERMA CATHETER REMOVAL N/A 06/03/2016   Procedure: Dialysis/Perma Catheter Removal;  Surgeon: Katha Cabal, MD;  Location: [place] [facility];  Service: Cardiovascular;  Laterality: N/A;  . EYE SURGERY    . HEART TRANSPLANT    . PERIPHERAL VASCULAR CATHETERIZATION N/A 02/08/2016   Procedure: Dialysis/Perma Catheter Insertion;  Surgeon: Katha Cabal, MD;  Location: [place] [facility];  Service: Cardiovascular;  Laterality: N/A;    SOCIAL HISTORY:   Social History   Tobacco Use  . Smoking status: Never Smoker  . Smokeless tobacco: Never Used  Substance Use Topics  . Alcohol use: No    FAMILY HISTORY:   Family History  Problem Relation Age of Onset  . Hypertension Father     DRUG ALLERGIES:   Allergies  Allergen Reactions  . Heparin Other (See Comments)    Heart transplant  . Hydralazine Swelling    REVIEW OF SYSTEMS:   Review of Systems  Constitutional: Positive for malaise/fatigue. Negative for chills, fever and weight loss.  HENT: Negative for congestion, hearing loss and sore throat.   Eyes: Negative for blurred vision and double vision.  Respiratory: Negative for cough, shortness of breath and wheezing.   Cardiovascular: Negative for chest pain, palpitations, orthopnea and leg swelling.  Gastrointestinal: Negative for abdominal pain, diarrhea, nausea and vomiting.  Genitourinary: Negative for dysuria and urgency.       Anuric  Musculoskeletal: Negative for myalgias.  Skin: Negative for rash.  Neurological: Negative for dizziness, sensory change, speech change, focal weakness and headaches.  Psychiatric/Behavioral: Negative for  depression.   MEDICATIONS AT HOME:   Prior to Admission medications   Medication Sig Start Date End Date Taking? Authorizing Provider  amLODipine (NORVASC) 5 MG tablet Take 5 mg by mouth 2 (two) times daily.   Yes [provider]  aspirin EC 81 MG tablet Take 81 mg by mouth every other  day.  08/03/09  Yes [provider]  carvedilol (COREG) 25 MG tablet Take 25 mg by mouth every 12 (twelve) hours.  09/09/15 12/24/18 Yes [provider]  Folic Acid-Vit H6-WVP X10 0.5-5-0.2 MG TABS Take by mouth daily.   Yes [provider]  lidocaine-prilocaine (EMLA) cream Apply 1 application topically as needed (for port access).   Yes [provider]  multivitamin (RENA-VIT) TABS tablet Take 1 tablet by mouth daily. 08/18/17  Yes [provider]  mycophenolate (CELLCEPT) 500 MG tablet Take 500 mg by mouth 2 (two) times daily.  12/19/15  Yes [provider]  pantoprazole (PROTONIX) 40 MG tablet Take 40 mg by mouth every evening.  04/04/15  Yes [provider]  pravastatin (PRAVACHOL) 20 MG tablet Take 20 mg by mouth at bedtime.    Yes [provider]  tacrolimus (PROGRAF) 1 MG capsule Take 1 mg by mouth 2 (two) times daily.    Yes [provider]  vitamin B-12 (CYANOCOBALAMIN) 1000 MCG tablet Take 1,000 mcg by mouth daily.   Yes [provider]    VITAL SIGNS:  Blood pressure (!) 197/90, pulse 72, temperature (!) 96.4 F (35.8 C), temperature source Tympanic, resp. rate 18, height $RemoveBe'5\' 1"'lqOLAqFvy$  (1.549 m), weight 44 kg, SpO2 100 %.  PHYSICAL EXAMINATION:   Physical Exam  GENERAL:  68 y.o.-year-old patient lying in the bed with no acute distress.  EYES: Pupils equal, round, reactive to light and accommodation. No scleral icterus. Extraocular muscles intact.  HEENT: Head atraumatic, normocephalic. Oropharynx and nasopharynx clear. Poor dentition. NECK:  Supple, no jugular venous distention. No thyroid enlargement, no tenderness.  LUNGS: Normal breath sounds bilaterally, no wheezing, rales,rhonchi or crepitation. No use of accessory muscles of respiration.  CARDIOVASCULAR: S1, S2 normal. No murmurs, rubs, or gallops.  ABDOMEN: Soft, nontender, nondistended. Bowel sounds present. No organomegaly or mass.   EXTREMITIES: No pedal edema, cyanosis, or clubbing. No rash or lesions. + pedal pulses MUSCULOSKELETAL: Normal bulk, and power was 5+ grip and elbow, knee, and ankle flexion and extension bilaterally.  NEUROLOGIC:Alert and oriented x 3. CN 2-12 intact. Sensation to light touch and cold stimuli intact bilaterally. Finger to nose nl. Babinski is downgoing. DTR's (biceps, patellar, and achilles) 2+ and symmetric throughout. Gait not tested due to safety concern. PSYCHIATRIC: The patient is alert and oriented x 3.  SKIN: No obvious rash, lesion, or ulcer.   DATA REVIEWED:  LABORATORY PANEL:   CBC No results for input(s): WBC, HGB, HCT, PLT in the last 168 hours. ------------------------------------------------------------------------------------------------------------------  Chemistries  Recent Labs  Lab 09/01/18 1342  K 6.6*   ------------------------------------------------------------------------------------------------------------------  Cardiac Enzymes No results for input(s): TROPONINI in the last 168 hours. ------------------------------------------------------------------------------------------------------------------  RADIOLOGY:  No results found.  EKG:  EKG: there are no previous tracings available for comparison.  IMPRESSION AND PLAN:   68 y.o. 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  1. Hyperkalemia - patient with hx of ESRD on HD MWF and on Tacrolimus - Asymptomatic - Admit to medsurg floor - Check labs  CBC, CMP - Check Tacrolimus level - Nephrology consult for HD today  2. ESRD - on HD MWF - Pending HD today  3. Anemia - Patient noted with heme positive stool concerning for GI bleed - H&H monitoring  -Transfuse PRN Hgb<7 - GI  following pending  EGD/Colonoscopy - Clear liquids, and NPO  after midnight - Hold NSAIDs, steroids, ASA * Continue Protonix  4. HTN  + Goal BP <140/90 - Continue amlodipine and Coreg - prn Labetalol  5. HLD  + Goal LDL<100 - Pravastatin $RemoveBefor'20mg'jXKkYrHdESIF$  PO qhs  6. DVT prophylaxis: hold anticoagulation due to suspected bleed and for procedure tomorrow - Hx of heparin -induced thrombocytopenia   All the records are reviewed and case discussed with ED provider. Management plans discussed with the patient, family and they are in agreement.  CODE STATUS: FULL  TOTAL TIME TAKING CARE OF THIS PATIENT: 40 minutes.    on 09/01/2018 at 3:26 PM  Rufina Falco, DNP, FNP-BC [facility] Nurse Practitioner Between 7am to 6pm - Pager [phone]  After 6pm go to [email or web address] - password [email or web address] [facility]  Office  [phone]  CC: Primary care physician; Diamond Nickel, MD

## 2018-09-01 NOTE — Consult Note (Signed)
[facility] GI Inpatient Consult Note   Ralph Allison, M.D.  Reason for Consult: heme positive stool    Attending Requesting Consult: Dr. Leslye Allison  Outpatient Primary Physician: Ralph Falco, NP  History of Present Illness: Ralph Allison is a 68 y.o. male with recent hx of progressive anemia and hemoccult positive stool. Colonoscopy in 2010 was essentially normal except for hemorrhoids. Patient denies abdominal pain, change in bowel habits or gross rectal bleeding. He denies GERD symptoms, dysphagia or involuntary weight loss.   He has a hx of Cardiac transplant and is on Hemodialysis for ESRD and followed by Ralph Allison. Patient was originally scheduled to have outpatient EGD and colonoscopy by me today, but the procedures had to be postponed due to hyperkalemia with K+ of 6.6.   Patient says he has not eaten in over 36 hours and feels weak. He denies chest pain or dyspnea.   Past Medical History:  Past Medical History:  Diagnosis Date  . Arthritis   . Heart transplant recipient [facility]) 2010  . Ralph   . Renal disorder    DIALYSIS M/W/F    Problem List: Patient Active Problem List   Diagnosis Date Noted  . Hyperkalemia 09/01/2018  . Acute renal failure (ARF) ([place]) 02/08/2016    Past Surgical History: Past Surgical History:  Procedure Laterality Date  . AV FISTULA PLACEMENT Left    ARM  . CATARACT EXTRACTION W/PHACO Right 11/03/2017   Procedure: CATARACT EXTRACTION PHACO AND INTRAOCULAR LENS PLACEMENT (IOC);  Surgeon: Ralph Robson, MD;  Location: [facility] ORS;  Service: Ophthalmology;  Laterality: Right;  [place] 00:34 AP% 14.9 CDE 5.08 Fluid pack lot # [id number]  . CATARACT EXTRACTION W/PHACO Left 12/29/2017   Procedure: CATARACT EXTRACTION PHACO AND INTRAOCULAR LENS PLACEMENT ([place]);  Surgeon: Ralph Robson, MD;  Location: [facility] ORS;  Service: Ophthalmology;  Laterality: Left;  [place]  00:30 CDE 5.27 fluid pack lot # [id number]  . DIALYSIS/PERMA CATHETER  REMOVAL N/A 06/03/2016   Procedure: Dialysis/Perma Catheter Removal;  Surgeon: Ralph Cabal, MD;  Location: [place] [facility];  Service: Cardiovascular;  Laterality: N/A;  . EYE SURGERY    . HEART TRANSPLANT    . PERIPHERAL VASCULAR CATHETERIZATION N/A 02/08/2016   Procedure: Dialysis/Perma Catheter Insertion;  Surgeon: Ralph Cabal, MD;  Location: [place] [facility];  Service: Cardiovascular;  Laterality: N/A;    Allergies: Allergies  Allergen Reactions  . Heparin Other (See Comments)    Heart transplant  . Hydralazine Swelling    Home Medications: Medications Prior to Admission  Medication Sig Dispense Refill Last Dose  . amLODipine (NORVASC) 5 MG tablet Take 5 mg by mouth 2 (two) times daily.   09/01/2018 at 0700  . aspirin EC 81 MG tablet Take 81 mg by mouth every other day.    Past Week at Unknown time  . carvedilol (COREG) 25 MG tablet Take 25 mg by mouth every 12 (twelve) hours.    09/01/2018 at Unknown time  . Folic Acid-Vit P8-EUM P53 0.5-5-0.2 MG TABS Take by mouth daily.     [place] Kitchen lidocaine-prilocaine (EMLA) cream Apply 1 application topically as needed (for port access).   Past Week at Unknown time  . multivitamin (RENA-VIT) TABS tablet Take 1 tablet by mouth daily.  6 Past Week at Unknown time  . mycophenolate (CELLCEPT) 500 MG tablet Take 500 mg by mouth 2 (two) times daily.    09/01/2018 at Unknown time  . pantoprazole (PROTONIX) 40 MG tablet Take 40 mg by  mouth every evening.    08/31/2018 at Unknown time  . pravastatin (PRAVACHOL) 20 MG tablet Take 20 mg by mouth at bedtime.    08/31/2018 at Unknown time  . tacrolimus (PROGRAF) 1 MG capsule Take 1 mg by mouth 2 (two) times daily.    09/01/2018 at Unknown time  . vitamin B-12 (CYANOCOBALAMIN) 1000 MCG tablet Take 1,000 mcg by mouth daily.   Past Week at Unknown time   Home medication reconciliation was completed with the patient.   Scheduled Inpatient Medications:   . amLODipine  5 mg Oral BID  . carvedilol  25 mg Oral  Q12H  . multivitamin  1 tablet Oral Daily  . mycophenolate  500 mg Oral BID  . pantoprazole  40 mg Oral QPM  . pravastatin  20 mg Oral QHS  . tacrolimus  1 mg Oral BID  . vitamin B-12  1,000 mcg Oral Daily    Continuous Inpatient Infusions:   . sodium chloride 20 mL/hr at 09/01/18 1300    PRN Inpatient Medications:  lidocaine-prilocaine  Family History: family history includes Ralph Allison.   GI Family History: Negative.   Social History:   reports that he has never smoked. He has never used smokeless tobacco. He reports that he does not drink alcohol or use drugs. The patient denies ETOH, tobacco, or drug use.    Review of Systems: Review of Systems - Negative except That in HPI  Physical Examination: BP (!) 197/90   Pulse 72   Temp (!) 96.4 F (35.8 C) (Tympanic)   Resp 18   Ht $R'5\' 1"'QO$  (1.549 m)   Wt 44 kg   SpO2 100%   BMI 18.33 kg/m  Physical Exam Constitutional:      General: He is not in acute distress.    Appearance: Normal appearance. He is not toxic-appearing or diaphoretic.  HENT:     Head: Normocephalic and atraumatic.     Right Ear: External ear normal.     Left Ear: External ear normal.     Nose: Nose normal.     Mouth/Throat:     Mouth: Mucous membranes are dry.  Eyes:     Conjunctiva/sclera: Conjunctivae normal.     Pupils: Pupils are equal, round, and reactive to light.  Cardiovascular:     Rate and Rhythm: Normal rate.     Pulses: Normal pulses.  Pulmonary:     Effort: Pulmonary effort is normal.     Breath sounds: Normal breath sounds.  Abdominal:     General: There is no distension.     Palpations: Abdomen is soft. There is no mass.     Tenderness: There is no abdominal tenderness. There is no rebound.     Hernia: No hernia is present.  Musculoskeletal: Normal range of motion.  Skin:    General: Skin is warm and dry.  Neurological:     General: No focal deficit present.     Mental Status: He is alert.  Psychiatric:         Mood and Affect: Mood normal.     Data: Lab Results  Component Value Date   WBC 6.3 09/02/2016   HGB 12.2 (L) 12/29/2017   HCT 36.0 (L) 12/29/2017   MCV 88.7 09/02/2016   PLT 190 09/02/2016   No results for input(s): HGB in the last 168 hours. Lab Results  Component Value Date   NA 138 12/29/2017   K 6.6 (HH) 09/01/2018   CL 97 (L)  09/02/2016   CO2 26 09/02/2016   BUN 28 (H) 09/02/2016   CREATININE 5.30 (H) 09/02/2016   Lab Results  Component Value Date   ALT 7 (L) 09/02/2016   AST 18 09/02/2016   ALKPHOS 95 09/02/2016   BILITOT 0.8 09/02/2016   No results for input(s): APTT, INR, PTT in the last 168 hours. CBC Latest Ref Rng & Units 12/29/2017 11/03/2017 09/02/2016  WBC 3.8 - 10.6 K/uL - - 6.3  Hemoglobin 13.0 - 17.0 g/dL 12.2(L) 10.2(L) 12.7(L)  Hematocrit 39.0 - 52.0 % 36.0(L) 30.0(L) 39.2(L)  Platelets 150 - 440 K/uL - - 190    STUDIES: No results found. $RemoveBefore'@IMAGES'iODlUFLnDepyc$ @  Assessment: 1. Anemia - multifactorial, but must rule out some occult GI bleeding given recent drop in Hgb to 7.4. Most recently after supplementation, the patient says his Hgb has risen to around 11. No localizing symptoms of bleeding are identified at this time which necessitates a more complete luminal evaluation.    2. Heme positive stool - DDx includes PUD, gastritis ,angiodysplasia, UGI or LGI malignancy.   COVID-19 status: Tested negative.  Recommendations:  1. Case discussed with patient's nephrologist, Dr. Rolly Salter as well as with Dr. Loletha Grayer, admitting hospitalist. Plan will be for hospital admission and hemodialysis this evening.   2. Clear liquid diet.  3. Tentatively plan  EGD and colonoscopy  tomorrow given that K+ is within acceptable range, which is expected.   I appreciate Dr. Rolly Salter and Dr. Marshia Ly management to allow the procedures to go forward in an inpatient setting.   The patient understands the nature of the planned procedure, indications, risks,  alternatives and potential complications including but not limited to bleeding, infection, perforation, damage to internal organs and possible oversedation/side effects from anesthesia. The patient agrees and gives consent to proceed.  Please refer to procedure notes for findings, recommendations and patient disposition/instructions.  Thank you for the consult. Please call with questions or concerns.  Olean Ree, "Lanny Hurst MD [facility] [place], [place] [address] [phone]  09/01/2018 3:46 PM

## 2018-09-01 NOTE — H&P (View-Only) (Signed)
[facility] GI Inpatient Consult Note   [name], M.D.  Reason for Consult: heme positive stool    Attending Requesting Consult: Dr. Leslye Peer  Outpatient Primary Physician: Rufina Falco, NP  History of Present Illness: Ralph Allison is a 68 y.o. male with recent hx of progressive anemia and hemoccult positive stool. Colonoscopy in 2010 was essentially normal except for hemorrhoids. Patient denies abdominal pain, change in bowel habits or gross rectal bleeding. He denies GERD symptoms, dysphagia or involuntary weight loss.   He has a hx of Cardiac transplant and is on Hemodialysis for ESRD and followed by DR. Kollaru. Patient was originally scheduled to have outpatient EGD and colonoscopy by me today, but the procedures had to be postponed due to hyperkalemia with K+ of 6.6.   Patient says he has not eaten in over 36 hours and feels weak. He denies chest pain or dyspnea.   Past Medical History:  Past Medical History:  Diagnosis Date  . Arthritis   . Heart transplant recipient [facility]) 2010  . Hypertension   . Renal disorder    DIALYSIS M/W/F    Problem List: Patient Active Problem List   Diagnosis Date Noted  . Hyperkalemia 09/01/2018  . Acute renal failure (ARF) ([place]) 02/08/2016    Past Surgical History: Past Surgical History:  Procedure Laterality Date  . AV FISTULA PLACEMENT Left    ARM  . CATARACT EXTRACTION W/PHACO Right 11/03/2017   Procedure: CATARACT EXTRACTION PHACO AND INTRAOCULAR LENS PLACEMENT (IOC);  Surgeon: Birder Robson, MD;  Location: [facility] ORS;  Service: Ophthalmology;  Laterality: Right;  [place] 00:34 AP% 14.9 CDE 5.08 Fluid pack lot # [id number]  . CATARACT EXTRACTION W/PHACO Left 12/29/2017   Procedure: CATARACT EXTRACTION PHACO AND INTRAOCULAR LENS PLACEMENT ([place]);  Surgeon: Birder Robson, MD;  Location: [facility] ORS;  Service: Ophthalmology;  Laterality: Left;  [place]  00:30 CDE 5.27 fluid pack lot # [id number]  . DIALYSIS/PERMA CATHETER  REMOVAL N/A 06/03/2016   Procedure: Dialysis/Perma Catheter Removal;  Surgeon: Katha Cabal, MD;  Location: [place] CV LAB;  Service: Cardiovascular;  Laterality: N/A;  . EYE SURGERY    . HEART TRANSPLANT    . PERIPHERAL VASCULAR CATHETERIZATION N/A 02/08/2016   Procedure: Dialysis/Perma Catheter Insertion;  Surgeon: Katha Cabal, MD;  Location: [place] CV LAB;  Service: Cardiovascular;  Laterality: N/A;    Allergies: Allergies  Allergen Reactions  . Heparin Other (See Comments)    Heart transplant  . Hydralazine Swelling    Home Medications: Medications Prior to Admission  Medication Sig Dispense Refill Last Dose  . amLODipine (NORVASC) 5 MG tablet Take 5 mg by mouth 2 (two) times daily.   09/01/2018 at 0700  . aspirin EC 81 MG tablet Take 81 mg by mouth every other day.    Past Week at Unknown time  . carvedilol (COREG) 25 MG tablet Take 25 mg by mouth every 12 (twelve) hours.    09/01/2018 at Unknown time  . Folic Acid-Vit C9-OBS J62 0.5-5-0.2 MG TABS Take by mouth daily.     Marland Kitchen lidocaine-prilocaine (EMLA) cream Apply 1 application topically as needed (for port access).   Past Week at Unknown time  . multivitamin (RENA-VIT) TABS tablet Take 1 tablet by mouth daily.  6 Past Week at Unknown time  . mycophenolate (CELLCEPT) 500 MG tablet Take 500 mg by mouth 2 (two) times daily.    09/01/2018 at Unknown time  . pantoprazole (PROTONIX) 40 MG tablet Take 40 mg by  mouth every evening.    08/31/2018 at Unknown time  . pravastatin (PRAVACHOL) 20 MG tablet Take 20 mg by mouth at bedtime.    08/31/2018 at Unknown time  . tacrolimus (PROGRAF) 1 MG capsule Take 1 mg by mouth 2 (two) times daily.    09/01/2018 at Unknown time  . vitamin B-12 (CYANOCOBALAMIN) 1000 MCG tablet Take 1,000 mcg by mouth daily.   Past Week at Unknown time   Home medication reconciliation was completed with the patient.   Scheduled Inpatient Medications:   . amLODipine  5 mg Oral BID  . carvedilol  25 mg Oral  Q12H  . multivitamin  1 tablet Oral Daily  . mycophenolate  500 mg Oral BID  . pantoprazole  40 mg Oral QPM  . pravastatin  20 mg Oral QHS  . tacrolimus  1 mg Oral BID  . vitamin B-12  1,000 mcg Oral Daily    Continuous Inpatient Infusions:   . sodium chloride 20 mL/hr at 09/01/18 1300    PRN Inpatient Medications:  lidocaine-prilocaine  Family History: family history includes Hypertension in his father.   GI Family History: Negative.   Social History:   reports that he has never smoked. He has never used smokeless tobacco. He reports that he does not drink alcohol or use drugs. The patient denies ETOH, tobacco, or drug use.    Review of Systems: Review of Systems - Negative except That in HPI  Physical Examination: BP (!) 197/90   Pulse 72   Temp (!) 96.4 F (35.8 C) (Tympanic)   Resp 18   Ht $R'5\' 1"'zc$  (1.549 m)   Wt 44 kg   SpO2 100%   BMI 18.33 kg/m  Physical Exam Constitutional:      General: He is not in acute distress.    Appearance: Normal appearance. He is not toxic-appearing or diaphoretic.  HENT:     Head: Normocephalic and atraumatic.     Right Ear: External ear normal.     Left Ear: External ear normal.     Nose: Nose normal.     Mouth/Throat:     Mouth: Mucous membranes are dry.  Eyes:     Conjunctiva/sclera: Conjunctivae normal.     Pupils: Pupils are equal, round, and reactive to light.  Cardiovascular:     Rate and Rhythm: Normal rate.     Pulses: Normal pulses.  Pulmonary:     Effort: Pulmonary effort is normal.     Breath sounds: Normal breath sounds.  Abdominal:     General: There is no distension.     Palpations: Abdomen is soft. There is no mass.     Tenderness: There is no abdominal tenderness. There is no rebound.     Hernia: No hernia is present.  Musculoskeletal: Normal range of motion.  Skin:    General: Skin is warm and dry.  Neurological:     General: No focal deficit present.     Mental Status: He is alert.  Psychiatric:         Mood and Affect: Mood normal.     Data: Lab Results  Component Value Date   WBC 6.3 09/02/2016   HGB 12.2 (L) 12/29/2017   HCT 36.0 (L) 12/29/2017   MCV 88.7 09/02/2016   PLT 190 09/02/2016   No results for input(s): HGB in the last 168 hours. Lab Results  Component Value Date   NA 138 12/29/2017   K 6.6 (HH) 09/01/2018   CL 97 (L)  09/02/2016   CO2 26 09/02/2016   BUN 28 (H) 09/02/2016   CREATININE 5.30 (H) 09/02/2016   Lab Results  Component Value Date   ALT 7 (L) 09/02/2016   AST 18 09/02/2016   ALKPHOS 95 09/02/2016   BILITOT 0.8 09/02/2016   No results for input(s): APTT, INR, PTT in the last 168 hours. CBC Latest Ref Rng & Units 12/29/2017 11/03/2017 09/02/2016  WBC 3.8 - 10.6 K/uL - - 6.3  Hemoglobin 13.0 - 17.0 g/dL 12.2(L) 10.2(L) 12.7(L)  Hematocrit 39.0 - 52.0 % 36.0(L) 30.0(L) 39.2(L)  Platelets 150 - 440 K/uL - - 190    STUDIES: No results found. $RemoveBefore'@IMAGES'[id number]$ @  Assessment: 1. Anemia - multifactorial, but must rule out some occult GI bleeding given recent drop in Hgb to 7.4. Most recently after supplementation, the patient says his Hgb has risen to around 11. No localizing symptoms of bleeding are identified at this time which necessitates a more complete luminal evaluation.    2. Heme positive stool - DDx includes PUD, gastritis ,angiodysplasia, UGI or LGI malignancy.   COVID-19 status: Tested negative.  Recommendations:  1. Case discussed with patient's nephrologist, Dr. Rolly Salter as well as with Dr. Loletha Grayer, admitting hospitalist. Plan will be for hospital admission and hemodialysis this evening.   2. Clear liquid diet.  3. Tentatively plan  EGD and colonoscopy  tomorrow given that K+ is within acceptable range, which is expected.   I appreciate Dr. Rolly Salter and Dr. Marshia Ly management to allow the procedures to go forward in an inpatient setting.   The patient understands the nature of the planned procedure, indications, risks,  alternatives and potential complications including but not limited to bleeding, infection, perforation, damage to internal organs and possible oversedation/side effects from anesthesia. The patient agrees and gives consent to proceed.  Please refer to procedure notes for findings, recommendations and patient disposition/instructions.  Thank you for the consult. Please call with questions or concerns.  Olean Ree, "Lanny Hurst MD [facility] [place], [place] [address] [phone]  09/01/2018 3:46 PM

## 2018-09-01 NOTE — Progress Notes (Signed)
MD paged due to patient blood pressure reading 199/93. MD states that we will reeval after dialysis.

## 2018-09-01 NOTE — Progress Notes (Signed)
HD tx started    09/01/18 2115  Vital Signs  Pulse Rate 74  Pulse Rate Source Monitor  Resp 16  BP (!) 198/98  BP Location Right Arm  BP Method Automatic  Patient Position (if appropriate) Sitting  Oxygen Therapy  SpO2 100 %  O2 Device Room Air  Pulse Oximetry Type Continuous  During Hemodialysis Assessment  Blood Flow Rate (mL/min) 400 mL/min  Arterial Pressure (mmHg) -200 mmHg  Venous Pressure (mmHg) 250 mmHg  Transmembrane Pressure (mmHg) 40 mmHg  Ultrafiltration Rate (mL/min) 830 mL/min  Dialysate Flow Rate (mL/min) 600 ml/min  Conductivity: Machine  13.4  HD Safety Checks Performed Yes  Dialysis Fluid Bolus Normal Saline  Bolus Amount (mL) 250 mL  Intra-Hemodialysis Comments Tx initiated

## 2018-09-01 NOTE — Progress Notes (Signed)
Pre HD [facility]    09/01/18 2110  Hand-Off documentation  Report given to (Full Name) Beatris Ship, RN   Report received from (Full Name) Basil Dess, RN   Vital Signs  Temp 97.9 F (36.6 C)  Temp Source Oral  Pulse Rate 76  Pulse Rate Source Monitor  Resp 18  BP (!) 187/101  BP Location Right Arm  BP Method Automatic  Patient Position (if appropriate) Sitting  Oxygen Therapy  SpO2 100 %  O2 Device Room Air  Pulse Oximetry Type Continuous  Pain Assessment  Pain Scale 0-10  Pain Score 0  Dialysis Weight  Weight 44 kg  Type of Weight Pre-Dialysis  Time-Out for Hemodialysis  What Procedure? HD  Pt Identifiers(min of two) First/Last Name;MRN/Account#  Correct Site? Yes  Correct Side? Yes  Correct Procedure? Yes  Consents Verified? Yes  Rad Studies Available? N/A  Safety Precautions Reviewed? Yes  Engineer, civil (consulting) Number 5  Station Number  (Bedside ICU 20 )  UF/Alarm Test Passed  Conductivity: Meter 14  Conductivity: Machine  14  pH 7.4  Reverse Osmosis WRO1  Normal Saline Lot Number [id number]  Dialyzer Lot Number [id number]  Disposable Set Lot Number [id number]  Machine Temperature 98.6 F (37 C)  Musician and Audible Yes  Blood Lines Intact and Secured Yes  Pre Treatment Patient Checks  Vascular access used during treatment Fistula  Patient is receiving dialysis in a chair Yes  Hepatitis B Surface Antigen Results Pending  Isolation Initiated Yes  Date Hepatitis B Surface Antibody Drawn 09/01/18  Hemodialysis Consent Verified Yes  Hemodialysis Standing Orders Initiated Yes  ECG (Telemetry) Monitor On Yes  Prime Ordered Normal Saline  Length of  DialysisTreatment -hour(s) 3 Hour(s)  Dialysis Treatment Comments Na 140  Dialyzer Elisio 17H NR  Dialysate 2K;2.5 Ca (Verbal order change )  Dialysis Anticoagulant None  Dialysate Flow Ordered 600  Blood Flow Rate Ordered 400 mL/min  Pre Treatment Labs Hepatitis B Surface Antigen;Renal panel;CBC   Dialysis Blood Pressure Support Ordered Normal Saline  Education / Care Plan  Dialysis Education Provided Yes  Documented Education in Care Plan Yes

## 2018-09-01 NOTE — H&P (Signed)
Outpatient short stay form Pre-procedure 09/01/2018 12:50 PM Jamilette Suchocki K. Alice Reichert, M.D.  Primary Physician: Verneda Skill, M.D.  Reason for visit:  Anemia secondary to blood loss, hemoccult positive stool  History of present illness: Patient is a 67 year old male with a history of cardiac transplantation on Prograf as well as history of end-stage renal disease on hemodialysis presenting for Hemoccult positive stool and anemia.  His renal team is noticed his hemoglobin trending downward over the last several months.  He presents today for elective EGD and colonoscopy to evaluate anemia and heme positive stool.Colonoscopy performed in 2010 showed only hemorrhoids. No UGI symptoms whatsoever.   Current Facility-Administered Medications:  .  0.9 %  sodium chloride infusion, , Intravenous, Continuous, Alex Mcmanigal, Benay Pike, MD  Medications Prior to Admission  Medication Sig Dispense Refill Last Dose  . amLODipine (NORVASC) 5 MG tablet Take 5 mg by mouth 2 (two) times daily.   09/01/2018 at 0700  . aspirin EC 81 MG tablet Take 81 mg by mouth every other day.    Past Week at Unknown time  . carvedilol (COREG) 25 MG tablet Take 25 mg by mouth every 12 (twelve) hours.    09/01/2018 at Unknown time  . Folic Acid-Vit B1-YNW G95 0.5-5-0.2 MG TABS Take by mouth daily.     Marland Kitchen lidocaine-prilocaine (EMLA) cream Apply 1 application topically as needed (for port access).   Past Week at Unknown time  . multivitamin (RENA-VIT) TABS tablet Take 1 tablet by mouth daily.  6 Past Week at Unknown time  . mycophenolate (CELLCEPT) 500 MG tablet Take 500 mg by mouth 2 (two) times daily.    09/01/2018 at Unknown time  . pantoprazole (PROTONIX) 40 MG tablet Take 40 mg by mouth every evening.    08/31/2018 at Unknown time  . pravastatin (PRAVACHOL) 20 MG tablet Take 20 mg by mouth at bedtime.    08/31/2018 at Unknown time  . tacrolimus (PROGRAF) 1 MG capsule Take 1 mg by mouth 2 (two) times daily.    09/01/2018 at Unknown time  . vitamin B-12  (CYANOCOBALAMIN) 1000 MCG tablet Take 1,000 mcg by mouth daily.   Past Week at Unknown time     Allergies  Allergen Reactions  . Heparin Other (See Comments)    Heart transplant  . Hydralazine Swelling     Past Medical History:  Diagnosis Date  . Arthritis   . Heart transplant recipient [facility]) 2010  . Hypertension   . Renal disorder    DIALYSIS M/W/F    Review of systems:  Otherwise negative.    Physical Exam  Gen: Alert, oriented. Appears stated age.  HEENT: [place]/AT. PERRLA. Lungs: CTA, no wheezes. CV: RR nl S1, S2. Abd: soft, benign, no masses. BS+ Ext: No edema. Pulses 2+    Planned procedures: Proceed with EGD and colonoscopy. The patient understands the nature of the planned procedure, indications, risks, alternatives and potential complications including but not limited to bleeding, infection, perforation, damage to internal organs and possible oversedation/side effects from anesthesia. The patient agrees and gives consent to proceed.  Please refer to procedure notes for findings, recommendations and patient disposition/instructions.     Varina Hulon K. Alice Reichert, M.D. Gastroenterology 09/01/2018  12:50 PM

## 2018-09-02 ENCOUNTER — Encounter: Payer: Self-pay | Admitting: Anesthesiology

## 2018-09-02 ENCOUNTER — Inpatient Hospital Stay: Payer: Medicare Other | Admitting: Anesthesiology

## 2018-09-02 ENCOUNTER — Encounter
Admission: RE | Disposition: A | Discharge status: Home / Self Care | Payer: Self-pay | Source: Home / Self Care | Attending: Internal Medicine

## 2018-09-02 ENCOUNTER — Other Ambulatory Visit: Payer: Self-pay

## 2018-09-02 DIAGNOSIS — R195 Other fecal abnormalities: Secondary | ICD-10-CM | POA: Diagnosis not present

## 2018-09-02 DIAGNOSIS — E875 Hyperkalemia: Secondary | ICD-10-CM | POA: Diagnosis not present

## 2018-09-02 HISTORY — PX: ESOPHAGOGASTRODUODENOSCOPY (EGD) WITH PROPOFOL: SHX5813

## 2018-09-02 HISTORY — PX: COLONOSCOPY WITH PROPOFOL: SHX5780

## 2018-09-02 LAB — BASIC METABOLIC PANEL
Anion gap: 13 (ref 5–15)
BUN: 25 mg/dL — ABNORMAL HIGH (ref 8–23)
CO2: 27 mmol/L (ref 22–32)
Calcium: 8.7 mg/dL — ABNORMAL LOW (ref 8.9–10.3)
Chloride: 96 mmol/L — ABNORMAL LOW (ref 98–111)
Creatinine, Ser: 5.5 mg/dL — ABNORMAL HIGH (ref 0.61–1.24)
GFR calc Af Amer: 11 mL/min — ABNORMAL LOW (ref >60)
GFR calc non Af Amer: 10 mL/min — ABNORMAL LOW (ref >60)
Glucose, Bld: 83 mg/dL (ref 70–99)
Potassium: 4.1 mmol/L (ref 3.5–5.1)
Sodium: 136 mmol/L (ref 135–145)

## 2018-09-02 LAB — POCT I-STAT 4, (NA,K, GLUC, HGB,HCT)
Glucose, Bld: 67 mg/dL — ABNORMAL LOW (ref 70–99)
HCT: 42 % (ref 39.0–52.0)
Hemoglobin: 14.3 g/dL (ref 13.0–17.0)
Potassium: 6.2 mmol/L — ABNORMAL HIGH (ref 3.5–5.1)
Sodium: 128 mmol/L — ABNORMAL LOW (ref 135–145)

## 2018-09-02 SURGERY — ESOPHAGOGASTRODUODENOSCOPY (EGD) WITH PROPOFOL
Anesthesia: General

## 2018-09-02 MED ORDER — VITAMIN C 500 MG PO TABS
500.0000 mg | ORAL_TABLET | Freq: Two times a day (BID) | ORAL | Status: DC
  Filled 2018-09-02: qty 1

## 2018-09-02 MED ORDER — PATIROMER SORBITEX CALCIUM 8.4 G PO PACK
25.2000 g | PACK | Freq: Every day | ORAL | Status: DC
  Administered 2018-09-02: 25.2 g via ORAL
  Filled 2018-09-02: qty 3

## 2018-09-02 MED ORDER — PROPOFOL 500 MG/50ML IV EMUL
INTRAVENOUS | Status: DC | PRN
  Administered 2018-09-02: 75 ug/kg/min via INTRAVENOUS

## 2018-09-02 MED ORDER — PROPOFOL 10 MG/ML IV BOLUS
INTRAVENOUS | Status: DC | PRN
  Administered 2018-09-02: 20 mg via INTRAVENOUS

## 2018-09-02 MED ORDER — MIDAZOLAM HCL 2 MG/2ML IJ SOLN
INTRAMUSCULAR | Status: AC
  Filled 2018-09-02: qty 2

## 2018-09-02 MED ORDER — SODIUM CHLORIDE 0.9 % IV SOLN
INTRAVENOUS | Status: DC | PRN
  Administered 2018-09-02: 14:00:00 via INTRAVENOUS

## 2018-09-02 MED ORDER — MIDAZOLAM HCL 2 MG/2ML IJ SOLN
INTRAMUSCULAR | Status: DC | PRN
  Administered 2018-09-02: 1 mg via INTRAVENOUS

## 2018-09-02 MED ORDER — SODIUM CHLORIDE 0.9 % IV SOLN
INTRAVENOUS | Status: DC | PRN
  Administered 2018-09-02: 25 ug/min via INTRAVENOUS

## 2018-09-02 NOTE — Progress Notes (Signed)
Pt experienced 20 point B/P drop and is symptomatic,Pt became diaphoretic w/ nausea, no vomitting, dry heaving mostly   09/01/18 2314  Vital Signs  Pulse Rate 77  BP (!) 155/134  , UF turned off and MD notified,

## 2018-09-02 NOTE — Progress Notes (Signed)
Established hemodialysis patient known at [facility] MWF 5:20, patient will resume same schedule at discharge.   Elvera Bicker Dialysis Coordinator [phone]

## 2018-09-02 NOTE — Op Note (Addendum)
[facility] Patient Name: Ralph Allison Procedure Date: 09/02/2018 1:03 PM MRN: [id number] Account #: [id number] Date of Birth: April 23, 1950 Admit Type: Inpatient Age: 67 Room: [facility] ENDO ROOM 4 Gender: Male Note Status: Finalized Procedure:            Upper GI endoscopy Indications:          Iron deficiency anemia, Heme positive stool Providers:            Benay Pike. Niccolo Burggraf MD, MD Medicines:            Propofol per Anesthesia Complications:        No immediate complications. Procedure:            Pre-Anesthesia Assessment:                       - The risks and benefits of the procedure and the                        sedation options and risks were discussed with the                        patient. All questions were answered and informed                        consent was obtained.                       - Patient identification and proposed procedure were                        verified prior to the procedure by the nurse. The                        procedure was verified in the procedure room.                       - ASA Grade Assessment: III - A patient with severe                        systemic disease.                       - After reviewing the risks and benefits, the patient                        was deemed in satisfactory condition to undergo the                        procedure.                       After obtaining informed consent, the endoscope was                        passed under direct vision. Throughout the procedure,                        the patient's blood pressure, pulse, and oxygen                        saturations were monitored continuously. The Endoscope  was introduced through the mouth, and advanced to the                        third part of duodenum. The upper GI endoscopy was                        accomplished without difficulty. The patient tolerated                        the procedure  well. Findings:      A widely patent Schatzki ring was found in the distal esophagus.      A small hiatal hernia was present.      The examined duodenum was normal.      The exam was otherwise without abnormality. Impression:           - Widely patent Schatzki ring.                       - Small hiatal hernia.                       - Normal examined duodenum.                       - The examination was otherwise normal.                       - No specimens collected. Recommendation:       - Proceed with colonoscopy Procedure Code(s):    --- Professional ---                       [phone], Esophagogastroduodenoscopy, flexible, transoral;                        diagnostic, including collection of specimen(s) by                        brushing or washing, when performed (separate procedure) Diagnosis Code(s):    --- Professional ---                       R19.5, Other fecal abnormalities                       D50.9, Iron deficiency anemia, unspecified                       K44.9, Diaphragmatic hernia without obstruction or                        gangrene                       K22.2, Esophageal obstruction CPT copyright 2019 [employer]. All rights reserved. The codes documented in this report are preliminary and upon coder review may  be revised to meet current compliance requirements. Efrain Sella MD, MD 09/02/2018 1:45:06 PM This report has been signed electronically. Number of Addenda: 0 Note Initiated On: 09/02/2018 1:03 PM Estimated Blood Loss: Estimated blood loss: none.      [facility]

## 2018-09-02 NOTE — Transfer of Care (Signed)
Immediate Anesthesia Transfer of Care Note  Patient: Ralph Allison  Procedure(s) Performed: ESOPHAGOGASTRODUODENOSCOPY (EGD) WITH PROPOFOL (N/A ) COLONOSCOPY WITH PROPOFOL (N/A )  Patient Location: PACU and Endoscopy Unit  Anesthesia Type:General  Level of Consciousness: awake, alert  and oriented  Airway & Oxygen Therapy: Patient Spontanous Breathing  Post-op Assessment: Report given to RN and Post -op Vital signs reviewed and stable  Post vital signs: Reviewed and stable  Last Vitals:  Vitals Value Taken Time  BP 144/78 09/02/18 1406  Temp 36.1 C 09/02/18 1406  Pulse 77 09/02/18 1409  Resp 16 09/02/18 1409  SpO2 100 % 09/02/18 1409  Vitals shown include unvalidated device data.  Last Pain:  Vitals:   09/02/18 1406  TempSrc: Tympanic  PainSc:          Complications: No apparent anesthesia complications

## 2018-09-02 NOTE — Op Note (Signed)
[facility] Gastroenterology Patient Name: Ralph Allison Procedure Date: 09/02/2018 1:02 PM MRN: [id number] Account #: [id number] Date of Birth: 19-Sep-1950 Admit Type: Inpatient Age: 68 Room: [facility] ENDO ROOM 4 Gender: Male Note Status: Finalized Procedure:            Colonoscopy Indications:          Heme positive stool, Iron deficiency anemia Providers:            Benay Pike. Zamauri Nez MD, MD Medicines:            Propofol per Anesthesia Complications:        No immediate complications. Procedure:            Pre-Anesthesia Assessment:                       - The risks and benefits of the procedure and the                        sedation options and risks were discussed with the                        patient. All questions were answered and informed                        consent was obtained.                       - Patient identification and proposed procedure were                        verified prior to the procedure by the nurse. The                        procedure was verified in the procedure room.                       - ASA Grade Assessment: III - A patient with severe                        systemic disease.                       - After reviewing the risks and benefits, the patient                        was deemed in satisfactory condition to undergo the                        procedure.                       After obtaining informed consent, the colonoscope was                        passed under direct vision. Throughout the procedure,                        the patient's blood pressure, pulse, and oxygen                        saturations were monitored continuously. The  Colonoscope was introduced through the anus and                        advanced to the the cecum, identified by appendiceal                        orifice and ileocecal valve. The colonoscopy was                        performed without difficulty. The patient  tolerated the                        procedure well. The quality of the bowel preparation                        was adequate. The ileocecal valve, appendiceal orifice,                        and rectum were photographed. Findings:      The perianal and digital rectal examinations were normal. Pertinent       negatives include normal sphincter tone and no palpable rectal lesions.      Segmental mild inflammation characterized by congestion (edema) and       granularity was found in the ascending colon. Biopsies were taken with a       cold forceps for histology.      Scattered mild inflammation characterized by congestion (edema) and       granularity was found in the descending colon. Biopsies were taken with       a cold forceps for histology.      The rectum appeared normal. Biopsies were taken with a cold forceps for       histology.      Non-bleeding internal hemorrhoids were found during retroflexion. The       hemorrhoids were Grade I (internal hemorrhoids that do not prolapse). Impression:           - Segmental mild inflammation was found in the                        ascending colon secondary to colitis. Biopsied.                       - Scattered mild inflammation was found in the                        descending colon secondary to colitis. Biopsied.                       - The rectum is normal. Biopsied.                       - Non-bleeding internal hemorrhoids. Recommendation:       - Await pathology results.                       - Return patient to hospital ward for possible                        discharge same day.                       -  Advance diet as tolerated.                       - Return to GI office PRN.                       - The findings and recommendations were discussed with                        the patient. Procedure Code(s):    --- Professional ---                       [phone], Colonoscopy, flexible; with biopsy, single or                         multiple Diagnosis Code(s):    --- Professional ---                       D50.9, Iron deficiency anemia, unspecified                       R19.5, Other fecal abnormalities                       K64.0, First degree hemorrhoids                       K52.9, Noninfective gastroenteritis and colitis,                        unspecified CPT copyright 2019 American Medical Association. All rights reserved. The codes documented in this report are preliminary and upon coder review may  be revised to meet current compliance requirements. Efrain Sella MD, MD 09/02/2018 2:04:19 PM This report has been signed electronically. Number of Addenda: 0 Note Initiated On: 09/02/2018 1:02 PM Scope Withdrawal Time: 0 hours 9 minutes 7 seconds  Total Procedure Duration: 0 hours 12 minutes 50 seconds       [facility]

## 2018-09-02 NOTE — Interval H&P Note (Signed)
History and Physical Interval Note:  09/02/2018 1:31 PM  Ralph Allison  has presented today for surgery, with the diagnosis of Anemia.  The various methods of treatment have been discussed with the patient and family. After consideration of risks, benefits and other options for treatment, the patient has consented to  Procedure(s): ESOPHAGOGASTRODUODENOSCOPY (EGD) WITH PROPOFOL (N/A) COLONOSCOPY WITH PROPOFOL (N/A) as a surgical intervention.  The patient's history has been reviewed, patient examined, no change in status, stable for surgery.  I have reviewed the patient's chart and labs.  Questions were answered to the patient's satisfaction.     [place], [place]

## 2018-09-02 NOTE — Anesthesia Preprocedure Evaluation (Signed)
Anesthesia Evaluation  Patient identified by MRN, date of birth, ID band Patient awake    Reviewed: Allergy & Precautions, NPO status , Patient's Chart, lab work & pertinent test results, reviewed documented beta blocker date and time   Airway Mallampati: II  TM Distance: >3 FB     Dental  (+) Chipped   Pulmonary           Cardiovascular hypertension, Pt. on medications and Pt. on home beta blockers      Neuro/Psych    GI/Hepatic   Endo/Other    Renal/GU ESRFRenal disease     Musculoskeletal  (+) Arthritis ,   Abdominal   Peds  Hematology   Anesthesia Other Findings Heart transplant, on dialysis. K now 4.1. Last EF 2 yr ago was 55-60. 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 BPs have been high.  Reproductive/Obstetrics                             Anesthesia Physical Anesthesia Plan  ASA: III  Anesthesia Plan: General   Post-op Pain Management:    Induction: Intravenous  PONV Risk Score and Plan:   Airway Management Planned:   Additional Equipment:   Intra-op Plan:   Post-operative Plan:   Informed Consent: I have reviewed the patients History and Physical, chart, labs and discussed the procedure including the risks, benefits and alternatives for the proposed anesthesia with the patient or authorized representative who has indicated his/her understanding and acceptance.       Plan Discussed with: CRNA  Anesthesia Plan Comments:         Anesthesia Quick Evaluation

## 2018-09-02 NOTE — Progress Notes (Signed)
Discharge order received. Patient mental status is at baseline. Vital signs stable . No signs of acute distress. Discharge instructions given. Patient verbalized understanding. No other issues noted at this time.   

## 2018-09-02 NOTE — Progress Notes (Signed)
HD TX ended, requested by pt, although B/P recovered, pt did not feel good and asked to stop treatment.   09/01/18 2330  During Hemodialysis Assessment  HD Safety Checks Performed Yes  KECN 45.1 [facility]  Dialysis Fluid Bolus Normal Saline  Bolus Amount (mL) 250 mL  Intra-Hemodialysis Comments Tx completed (P)

## 2018-09-02 NOTE — Progress Notes (Signed)
Post HD Assessment    09/02/18 0006  Neurological  Level of Consciousness Alert  Orientation Level Oriented X4  Respiratory  Respiratory Pattern Regular;Unlabored  Chest Assessment Chest expansion symmetrical  Bilateral Breath Sounds Clear;Diminished  Cough None  Cardiac  Pulse Regular  Heart Sounds S1, S2  ECG Monitor Yes  Vascular  R Radial Pulse +2  L Radial Pulse +2  Edema Generalized  Generalized Edema None  Psychosocial  Psychosocial (WDL) WDL

## 2018-09-02 NOTE — Anesthesia Post-op Follow-up Note (Signed)
Anesthesia QCDR form completed.        

## 2018-09-02 NOTE — Progress Notes (Signed)
Post HD [facility]    09/01/18 2336  Hand-Off documentation  Report given to (Full Name) Eduardo Osier, RN   Report received from (Full Name) Gara Kroner, RN   Vital Signs  Temp 98 F (36.7 C)  Temp Source Oral  Pulse Rate 87  Pulse Rate Source Monitor  Resp 16  BP (!) 181/100  BP Location Right Arm  BP Method Automatic  Patient Position (if appropriate) Sitting  Oxygen Therapy  SpO2 99 %  O2 Device Room Air  Pulse Oximetry Type Continuous  Pain Assessment  Pain Scale 0-10  Pain Score 0  Dialysis Weight  Weight 42.8 kg  Type of Weight Post-Dialysis  Post-Hemodialysis Assessment  Rinseback Volume (mL) 250 mL  KECN 45.1 V  Dialyzer Clearance Lightly streaked  Duration of HD Treatment -hour(s) 2.25 hour(s)  Hemodialysis Intake (mL) 500 mL  UF Total -Machine (mL) 2315 mL  Net UF (mL) 1815 mL  Tolerated HD Treatment No (Comment)  AVG/AVF Arterial Site Held (minutes) 10 minutes  AVG/AVF Venous Site Held (minutes) 5 minutes

## 2018-09-02 NOTE — Progress Notes (Signed)
[facility]  ROUNDING NOTE   Subjective:   Hemodialysis treatment dialysis last night. UF of 1.8.  K 4.1  Colonoscopy today  Objective:  Vital signs in last 24 hours:  Temp:  [97.7 F (36.5 C)-98.9 F (37.2 C)] 98.2 F (36.8 C) (07/09 1256) Pulse Rate:  [72-87] 80 (07/09 1256) Resp:  [12-18] 12 (07/09 1256) BP: (155-199)/(82-134) 175/82 (07/09 1256) SpO2:  [97 %-100 %] 100 % (07/09 1256) Weight:  [42.8 kg-44 kg] 42.8 kg (07/09 1256)  Weight change:  Filed Weights   09/01/18 2110 09/01/18 2336 09/02/18 1256  Weight: 44 kg 42.8 kg 42.8 kg    Intake/Output: I/O last 3 completed shifts: In: -  Out: 1815 [Other:1815]   Intake/Output this shift:  No intake/output data recorded.  Physical Exam: General: NAD,   Head: Normocephalic, atraumatic. Moist oral mucosal membranes  Eyes: Anicteric, PERRL  Neck: Supple, trachea midline  Lungs:  Clear to auscultation  Heart: Regular rate and rhythm  Abdomen:  Soft, nontender,   Extremities:  no peripheral edema.  Neurologic: Nonfocal, moving all four extremities  Skin: No lesions  Access: Right AVF    Basic Metabolic Panel: Recent Labs  Lab 09/01/18 1332 09/01/18 1342 09/01/18 1734 09/02/18 0938  NA 128*  --  130* 136  K 6.2* 6.6* 5.6* 4.1  CL  --   --  96* 96*  CO2  --   --  20* 27  GLUCOSE 67*  --  98 83  BUN  --   --  46* 25*  CREATININE  --   --  7.64* 5.50*  CALCIUM  --   --  8.7* 8.7*    Liver Function Tests: Recent Labs  Lab 09/01/18 1734  AST 19  ALT 8  ALKPHOS 83  BILITOT 0.5  PROT 6.2*  ALBUMIN 3.8   No results for input(s): LIPASE, AMYLASE in the last 168 hours. No results for input(s): AMMONIA in the last 168 hours.  CBC: Recent Labs  Lab 09/01/18 1332 09/01/18 1734  WBC  --  5.9  NEUTROABS  --  4.8  HGB 14.3 12.8*  HCT 42.0 42.2  MCV  --  93.6  PLT  --  158    Cardiac Enzymes: No results for input(s): CKTOTAL, CKMB, CKMBINDEX, TROPONINI in the last 168  hours.  BNP: Invalid input(s): POCBNP  CBG: No results for input(s): GLUCAP in the last 168 hours.  Microbiology: Results for orders placed or performed during the hospital encounter of 09/01/18  MRSA PCR Screening     Status: None   Collection Time: 09/01/18  8:02 PM   Specimen: Nasopharyngeal  Result Value Ref Range Status   MRSA by PCR NEGATIVE NEGATIVE Final    Comment:        The GeneXpert MRSA Assay (FDA approved for NASAL specimens only), is one component of a comprehensive MRSA colonization surveillance program. It is not intended to diagnose MRSA infection nor to guide or monitor treatment for MRSA infections. Performed at [facility], [address] [place], [place] [address]     Coagulation Studies: No results for input(s): LABPROT, INR in the last 72 hours.  Urinalysis: No results for input(s): COLORURINE, LABSPEC, PHURINE, GLUCOSEU, HGBUR, BILIRUBINUR, KETONESUR, PROTEINUR, UROBILINOGEN, NITRITE, LEUKOCYTESUR in the last 72 hours.  Invalid input(s): APPERANCEUR    Imaging: No results found.   Medications:    . [MAR Hold] amLODipine  5 mg Oral BID  . [MAR Hold] carvedilol  25 mg Oral  Q12H  . [MAR Hold] multivitamin  1 tablet Oral Daily  . [MAR Hold] mycophenolate  500 mg Oral BID  . [MAR Hold] pantoprazole  40 mg Oral QPM  . [MAR Hold] patiromer  25.2 g Oral Daily  . [MAR Hold] pravastatin  20 mg Oral QHS  . [MAR Hold] tacrolimus  1 mg Oral BID  . [MAR Hold] vitamin B-12  1,000 mcg Oral Daily  . vitamin C  500 mg Oral BID   [MAR Hold] labetalol, [MAR Hold] lidocaine-prilocaine  Assessment/ Plan:  [name] is a 68 y.o. [place] male with end stage renal disease on hemodialysis, status post heart transplant, hypertension who is admitted to [facility] on 09/01/2018 for Hyperkalemia [E87.5]  1. Hyperkalemia: with end stage renal disease.  Dialysis yesterday.   2. Hypertension: home regimen of amlodipine and carvedilol.   3.  Anemia of chronic kidney disease: - Mircera as outpatient.   4. Secondary Hyperparathyroidism: PTH 255 on 7/1. Phosphorus and calcium at goal. Currently not on binders.   5. Heart transplant - tacrolimus   LOS: 1 Ralph Allison 7/9/20201:36 PM

## 2018-09-02 NOTE — Progress Notes (Signed)
Initial Nutrition Assessment  DOCUMENTATION CODES:   Underweight  INTERVENTION:   RD will order supplements once diet advanced  Rena-vite daily   Vitamin C $RemoveB'500mg'yWABRPgn$  po BID  NUTRITION DIAGNOSIS:   Increased nutrient needs related to chronic illness(ESRD on HD) as evidenced by increased estimated needs.  GOAL:   Patient will meet greater than or equal to 90% of their needs  MONITOR:   PO intake, Supplement acceptance, Labs, Weight trends, Skin, I & O's  REASON FOR ASSESSMENT:   Other (Comment)(low BMI)    ASSESSMENT:   68 y.o. male with pertinent past medical history of ESRD on hemodialysis MWF, hypertension, heparin-induced thrombocytopenia, heart transplant recipient on tacrolimus admitted to endoscopy for GIB which was postponed r/t hyperkalemia.  RD working remotely.  Pt with increased estimated needs r/t ESRD on HD. Pt currently NPO for EGD and colonoscopy today. RD will add supplements once diet advanced. RD will also add Vitamin C supplementation in setting of chronic HD and GIB. Per chart, pt with 10lb(10%) weight loss over the past 8 months; RD is unsure of how recent weight loss occurred.    Medications reviewed and include: rena-vite, cellcept, protonix, veltessa, B12, Vit C  Labs reviewed: K 4.1 wnl, creat 5.50(H)  Pt at high risk for malnutrition but unable to diagnose at this time as NFPE cannot be performed.   Unable to complete Nutrition-Focused physical exam at this time.   Diet Order:   Diet Order            Diet NPO time specified Except for: Ice Chips, Sips with Meds  Diet effective now             EDUCATION NEEDS:   No education needs have been identified at this time  Skin:  Skin Assessment: Reviewed RN Assessment  Last BM:  7/8  Height:   Ht Readings from Last 1 Encounters:  09/02/18 $RemoveB'5\' 1"'wymNgMcj$  (1.549 m)    Weight:   Wt Readings from Last 1 Encounters:  09/02/18 42.8 kg    Ideal Body Weight:  50.9 kg  BMI:  Body mass index is  17.83 kg/m.  Estimated Nutritional Needs:   Kcal:  1400-1600kcal/day  Protein:  70-80g/day  Fluid:  UOP + 1L  Koleen Distance MS, RD, LDN Pager #- [phone] Office#- [phone] After Hours Pager: [phone]

## 2018-09-02 NOTE — Discharge Summary (Signed)
[place] [facility] [place] NAME: Ralph Allison    MR#:  [id number]  DATE OF BIRTH:  May 17, 1950  DATE OF ADMISSION:  09/01/2018 ADMITTING PHYSICIAN: Lang Snow, NP  DATE OF DISCHARGE: 09/02/2018   PRIMARY CARE PHYSICIAN: Diamond Nickel, MD    ADMISSION DIAGNOSIS:  Hyperkalemia [E87.5]  DISCHARGE DIAGNOSIS:  Active Problems:   Hyperkalemia   SECONDARY DIAGNOSIS:   Past Medical History:  Diagnosis Date  . Arthritis   . Heart transplant recipient [facility]) 2010  . Hypertension   . Renal disorder    DIALYSIS M/W/F    HOSPITAL COURSE:   Patient was supposed to have colonoscopy and endoscopy as outpatient procedures but when he came he was noted to have hyperkalemia so was admitted to medical services to have his dialysis. His dialysis was done and next day his endoscopy and colonoscopy were done by GI which showed mild colitis but no major bleeding or abnormalities.  Patient was feeling fine and we are discharging home with advised to follow-up with primary care physician.  DISCHARGE CONDITIONS:  Stable.  CONSULTS OBTAINED:  Treatment Team:  Efrain Sella, MD  DRUG ALLERGIES:   Allergies  Allergen Reactions  . Heparin Other (See Comments)    Heart transplant  . Hydralazine Swelling    DISCHARGE MEDICATIONS:   Allergies as of 09/02/2018      Reactions   Heparin Other (See Comments)   Heart transplant   Hydralazine Swelling      Medication List    TAKE these medications   amLODipine 5 MG tablet Commonly known as: NORVASC Take 5 mg by mouth 2 (two) times daily.   aspirin EC 81 MG tablet Take 81 mg by mouth every other day.   carvedilol 25 MG tablet Commonly known as: COREG Take 25 mg by mouth every 12 (twelve) hours.   Folic Acid-Vit W5-IOE V03 0.5-5-0.2 MG Tabs Take by mouth daily.   lidocaine-prilocaine cream Commonly known as: EMLA Apply 1 application topically as needed (for port  access).   multivitamin Tabs tablet Take 1 tablet by mouth daily.   mycophenolate 500 MG tablet Commonly known as: CELLCEPT Take 500 mg by mouth 2 (two) times daily.   pantoprazole 40 MG tablet Commonly known as: PROTONIX Take 40 mg by mouth every evening.   pravastatin 20 MG tablet Commonly known as: PRAVACHOL Take 20 mg by mouth at bedtime.   tacrolimus 1 MG capsule Commonly known as: PROGRAF Take 1 mg by mouth 2 (two) times daily.   vitamin B-12 1000 MCG tablet Commonly known as: CYANOCOBALAMIN Take 1,000 mcg by mouth daily.        DISCHARGE INSTRUCTIONS:    Follow with primary care physician in 1 week.  If you experience worsening of your admission symptoms, develop shortness of breath, life threatening emergency, suicidal or homicidal thoughts you must seek medical attention immediately by calling 911 or calling your MD immediately  if symptoms less severe.  You Must read complete instructions/literature along with all the possible adverse reactions/side effects for all the Medicines you take and that have been prescribed to you. Take any new Medicines after you have completely understood and accept all the possible adverse reactions/side effects.   Please note  You were cared for by a hospitalist during your hospital stay. If you have any questions about your discharge medications or the care you received while you were in the hospital after you are discharged, you can  call the unit and asked to speak with the hospitalist on call if the hospitalist that took care of you is not available. Once you are discharged, your primary care physician will handle any further medical issues. Please note that NO REFILLS for any discharge medications will be authorized once you are discharged, as it is imperative that you return to your primary care physician (or establish a relationship with a primary care physician if you do not have one) for your aftercare needs so that they can  reassess your need for medications and monitor your lab values.    Today   CHIEF COMPLAINT:  No chief complaint on file.   HISTORY OF PRESENT ILLNESS:  Ralph Allison  is a 68 y.o. male with a known history of ESRD on hemodialysis MWF, hypertension, heparin-induced thrombocytopenia, heart transplant recipient on tacrolimus presenting from Endoscopy suite with hyperkalemia.  Per GI team, patient was noted by his renal team to have lower than normal hemoglobin over the last several months.  He presented today for elective EGD and colonoscopy to evaluate anemia and heme positive stool but was noted to have hyperkalemia therefore the procedure was held. On exam patient denies any symptoms, he report that he feels weak from not eating the whole day due to bowel prep for procedure today.  Hospitalist has been been contacted to admit patient for hemodialysis and hyperkalemia management.   VITAL SIGNS:  Blood pressure (!) 161/107, pulse 76, temperature 98 F (36.7 C), temperature source Oral, resp. rate 16, height $RemoveBe'5\' 1"'nsVsOZYIc$  (1.549 m), weight 42.8 kg, SpO2 100 %.  I/O:    Intake/Output Summary (Last 24 hours) at 09/02/2018 1642 Last data filed at 09/02/2018 1359 Gross per 24 hour  Intake 100 ml  Output 1815 ml  Net -1715 ml    PHYSICAL EXAMINATION:  GENERAL:  68 y.o.-year-old patient lying in the bed with no acute distress.  EYES: Pupils equal, round, reactive to light and accommodation. No scleral icterus. Extraocular muscles intact.  HEENT: Head atraumatic, normocephalic. Oropharynx and nasopharynx clear.  NECK:  Supple, no jugular venous distention. No thyroid enlargement, no tenderness.  LUNGS: Normal breath sounds bilaterally, no wheezing, rales,rhonchi or crepitation. No use of accessory muscles of respiration.  CARDIOVASCULAR: S1, S2 normal. No murmurs, rubs, or gallops.  ABDOMEN: Soft, non-tender, non-distended. Bowel sounds present. No organomegaly or mass.  EXTREMITIES: No pedal  edema, cyanosis, or clubbing.  NEUROLOGIC: Cranial nerves II through XII are intact. Muscle strength 5/5 in all extremities. Sensation intact. Gait not checked.  PSYCHIATRIC: The patient is alert and oriented x 3.  SKIN: No obvious rash, lesion, or ulcer.   DATA REVIEW:   CBC Recent Labs  Lab 09/01/18 1734  WBC 5.9  HGB 12.8*  HCT 42.2  PLT 158    Chemistries  Recent Labs  Lab 09/01/18 1734 09/02/18 0938  NA 130* 136  K 5.6* 4.1  CL 96* 96*  CO2 20* 27  GLUCOSE 98 83  BUN 46* 25*  CREATININE 7.64* 5.50*  CALCIUM 8.7* 8.7*  AST 19  --   ALT 8  --   ALKPHOS 83  --   BILITOT 0.5  --     Cardiac Enzymes No results for input(s): TROPONINI in the last 168 hours.  Microbiology Results  Results for orders placed or performed during the hospital encounter of 09/01/18  MRSA PCR Screening     Status: None   Collection Time: 09/01/18  8:02 PM   Specimen: Nasopharyngeal  Result  Value Ref Range Status   MRSA by PCR NEGATIVE NEGATIVE Final    Comment:        The GeneXpert MRSA Assay (FDA approved for NASAL specimens only), is one component of a comprehensive MRSA colonization surveillance program. It is not intended to diagnose MRSA infection nor to guide or monitor treatment for MRSA infections. Performed at [facility], [address] [place], [place] [address]     RADIOLOGY:  No results found.  EKG:   Orders placed or performed during the hospital encounter of 09/01/18  . EKG 12-Lead  . EKG 12-Lead  . EKG 12-Lead  . EKG 12-Lead      Management plans discussed with the patient, family and they are in agreement.  CODE STATUS:     Code Status Orders  (From admission, onward)         Start     Ordered   09/01/18 1506  Full code  Continuous     09/01/18 1508        Code Status History    Date Active Date Inactive Code Status Order ID Comments User Context   02/08/2016 1619 02/14/2016 1904 Full Code [id number]  Vaughan Basta, MD Inpatient   Advance Care Planning Activity      TOTAL TIME TAKING CARE OF THIS PATIENT: 35 minutes.    Vaughan Basta M.D on 09/02/2018 at 4:42 PM  Between 7am to 6pm - Pager - [phone]  After 6pm go to [email or web address] - password [email or web address] [facility]  Office  [phone]  CC: Primary care physician; Diamond Nickel, MD   Note: This dictation was prepared with Dragon dictation along with smaller phrase technology. Any transcriptional errors that result from this process are unintentional.

## 2018-09-02 NOTE — Anesthesia Postprocedure Evaluation (Signed)
Anesthesia Post Note  Patient: Ralph Allison  Procedure(s) Performed: ESOPHAGOGASTRODUODENOSCOPY (EGD) WITH PROPOFOL (N/A ) COLONOSCOPY WITH PROPOFOL (N/A )  Patient location during evaluation: Endoscopy Anesthesia Type: General Level of consciousness: awake and alert Pain management: pain level controlled Vital Signs Assessment: post-procedure vital signs reviewed and stable Respiratory status: spontaneous breathing, nonlabored ventilation, respiratory function stable and patient connected to nasal cannula oxygen Cardiovascular status: blood pressure returned to baseline and stable Postop Assessment: no apparent nausea or vomiting Anesthetic complications: no     Last Vitals:  Vitals:   09/02/18 1256 09/02/18 1406  BP: (!) 175/82 (!) 144/78  Pulse: 80 77  Resp: 12 13  Temp: 36.8 C (!) 36.1 C  SpO2: 100% 100%    Last Pain:  Vitals:   09/02/18 1406  TempSrc: Tympanic  PainSc:                  [name]

## 2018-09-03 LAB — HEPATITIS B SURFACE ANTIBODY, QUANTITATIVE: Hep B S AB Quant (Post): <3.1 m[IU]/mL — ABNORMAL LOW (ref >9.9)

## 2018-09-03 LAB — HEPATITIS B SURFACE ANTIGEN: Hepatitis B Surface Ag: NEGATIVE

## 2018-09-03 LAB — HIV ANTIBODY (ROUTINE TESTING W REFLEX): HIV Screen 4th Generation wRfx: NONREACTIVE

## 2018-09-04 LAB — TACROLIMUS LEVEL: Tacrolimus (FK506) - LabCorp: 16.7 ng/mL (ref 2.0–20.0)

## 2018-09-07 LAB — SURGICAL PATHOLOGY

## 2018-12-14 IMAGING — CT CT CHEST W/O CM
2 of 3 series · 15 of 36 positions shown, 18 images · non-contrast
Comparison: Chest radiograph on 02/10/2016

CLINICAL DATA: Shortness of breath and hypoxia. Bilateral
gallstone. Heart transplant patient. Chronic kidney disease.

EXAM:
CT CHEST WITHOUT CONTRAST
TECHNIQUE: Multidetector CT imaging of the chest was performed following the
standard protocol without IV contrast.

[Series 2: thorax · axial · 0.69mm/px · z∈[-535,-279]mm · 12 of 152 slices shown, 15 images]
[im 12/152  mediastinal]
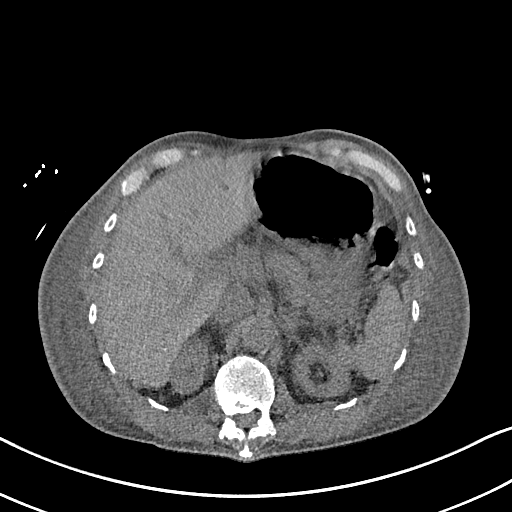
[im 12/152  lung]
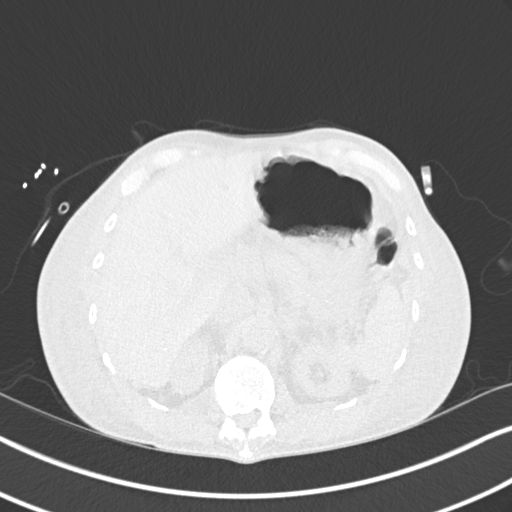
[im 23/152  lung]
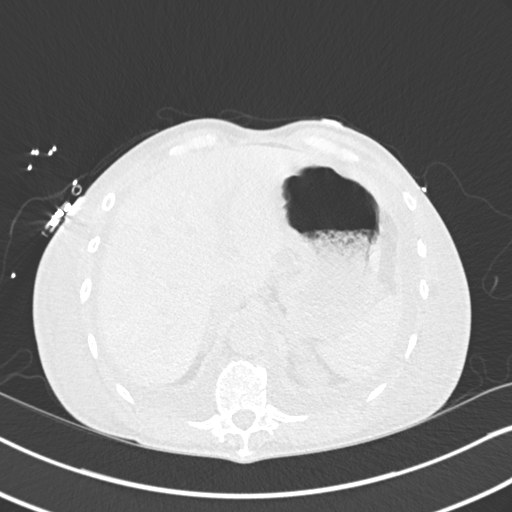
[im 34/152  lung]
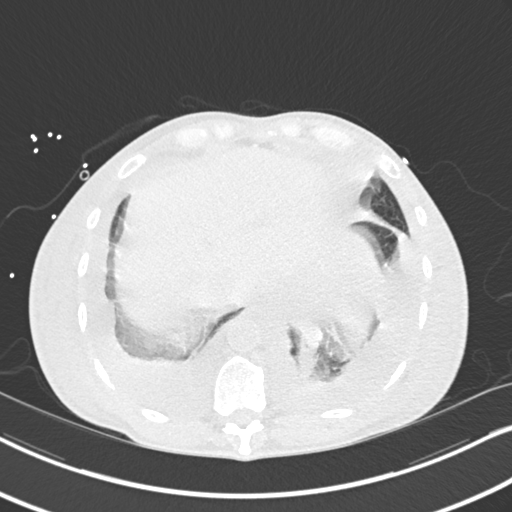
[im 45/152  lung]
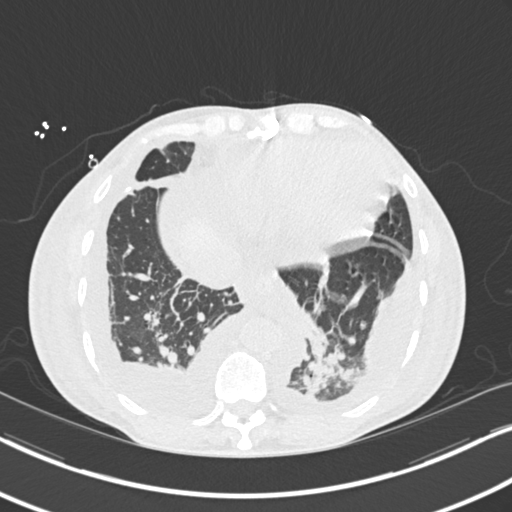
[im 56/152  mediastinal]
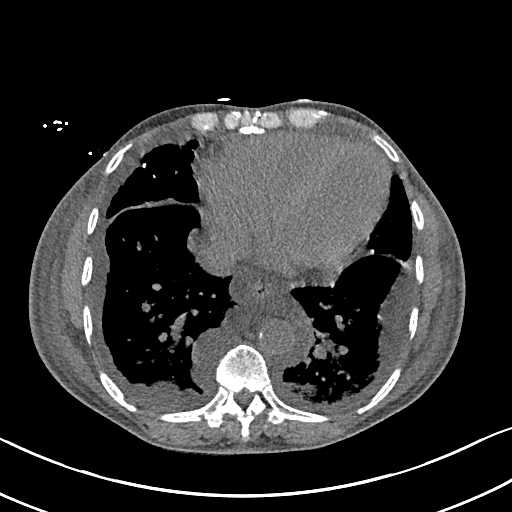
[im 56/152  lung]
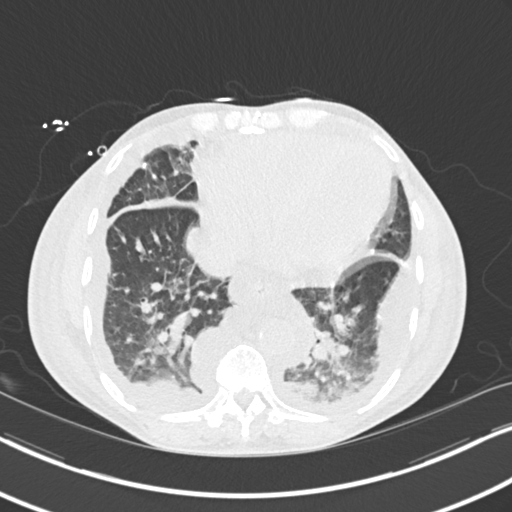
[im 68/152  lung]
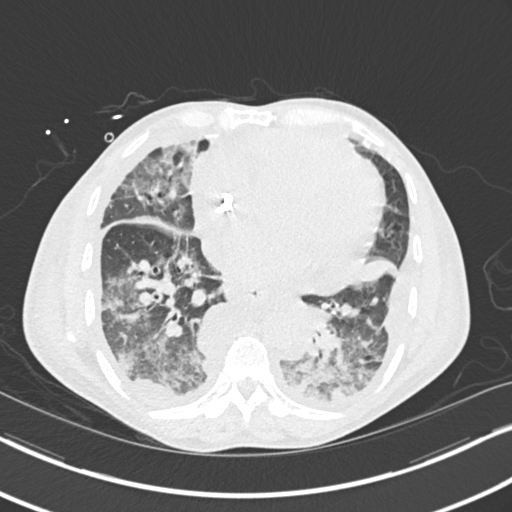
[im 84/152  lung]
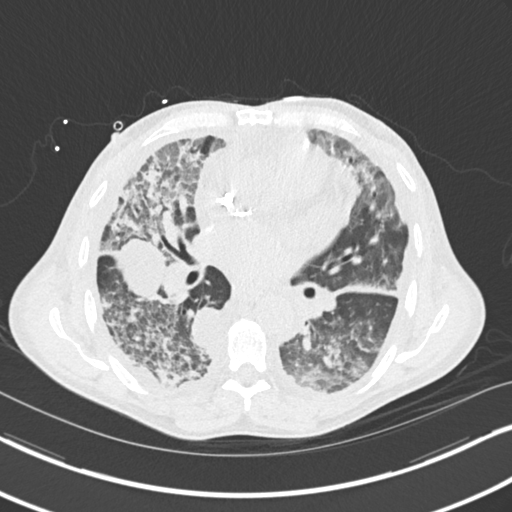
[im 96/152  lung]
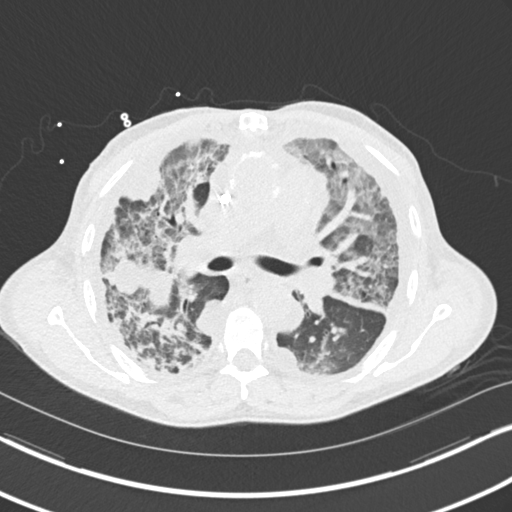
[im 107/152  mediastinal]
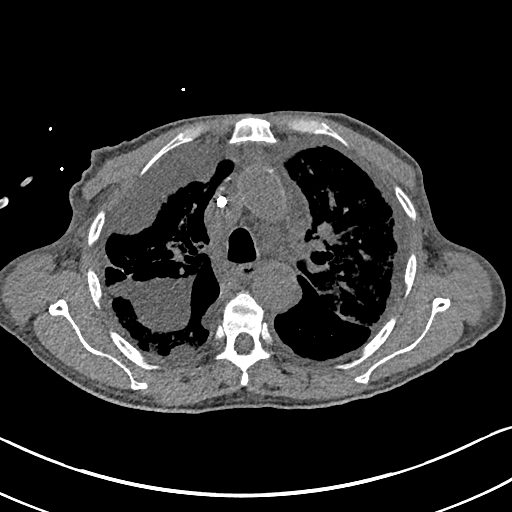
[im 107/152  lung]
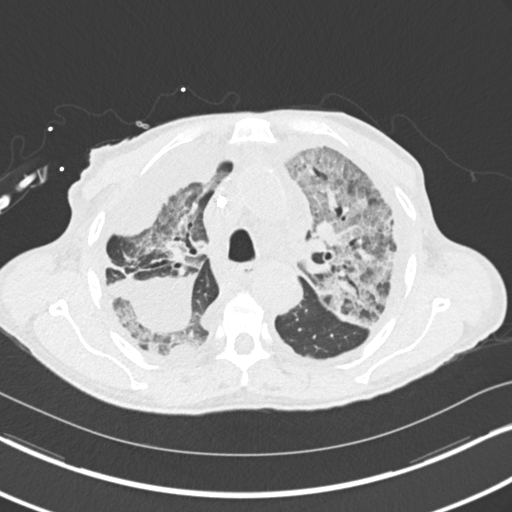
[im 118/152  lung]
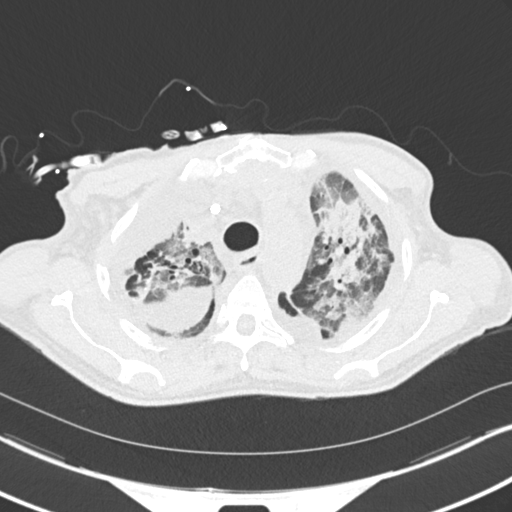
[im 129/152  lung]
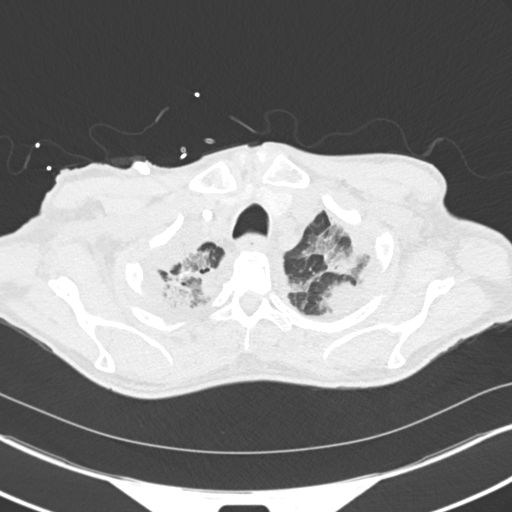
[im 140/152  lung]
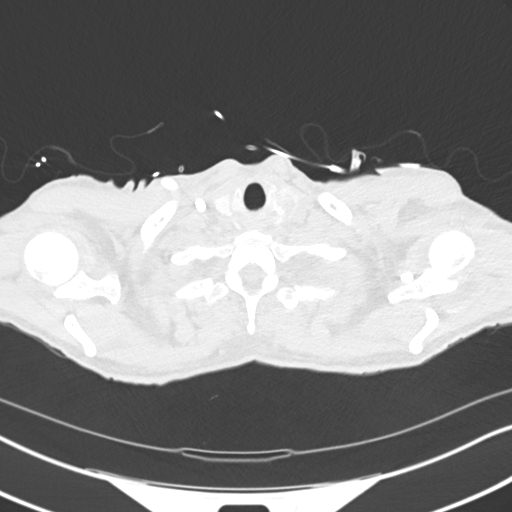

[Series 5: coronal · coronal · 0.59mm/px · 3 of 128 slices shown]
[im 26/128  lung]
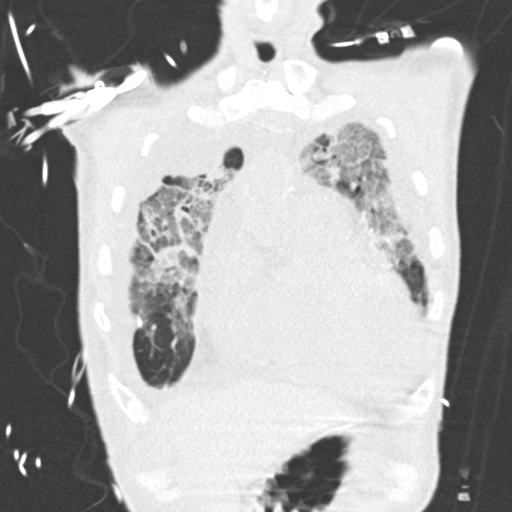
[im 51/128  lung]
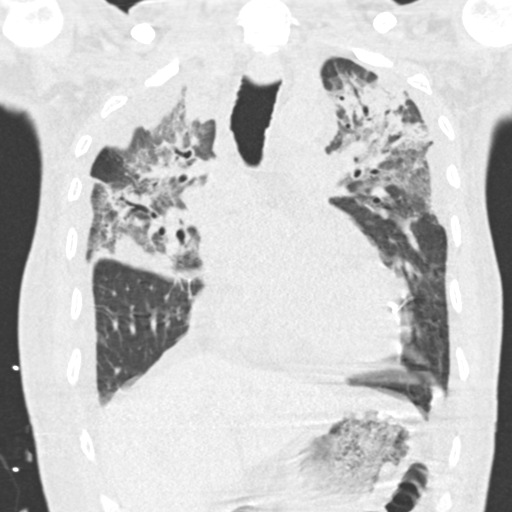
[im 77/128  lung]
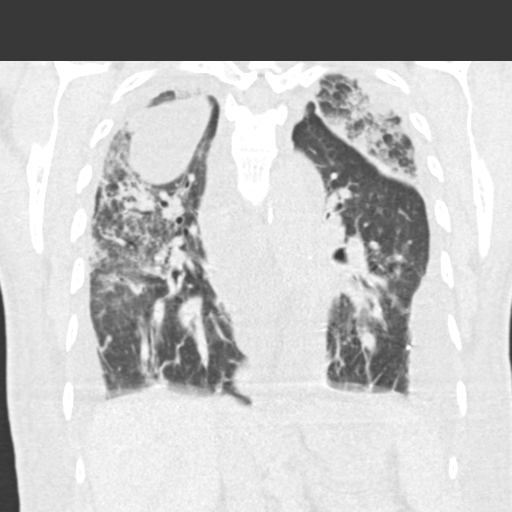

[15 of 36 positions shown; findings below may reference images not displayed]

FINDINGS: Cardiovascular: Heart size is at the upper limits of normal. Aortic
atherosclerosis. Right jugular central venous dialysis catheter with
tip in the right atrium.

Mediastinum/Nodes: No definite lymphadenopathy identified on this
unenhanced exam. A 2.3 cm low-attenuation left thyroid lobe nodule
is seen.

Lungs/Pleura: Small multilocular bilateral pleural effusions are
seen, with extension into the right major fissure. Diffuse bilateral
airspace disease is seen likely due to pulmonary edema. No definite
pulmonary mass identified.

Upper Abdomen:  Diffuse body wall edema.

Musculoskeletal:  No suspicious bone lesions.
IMPRESSION: Diffuse bilateral airspace disease, small multilocular bilateral
pleural effusions, and diffuse body wall edema, likely due to
congestive heart failure.

2.3 cm low-attenuation left thyroid lobe nodule. Thyroid ultrasound
recommended for further evaluation . This follows ACR consensus
guidelines: Managing Incidental Thyroid Nodules Detected on Imaging:
White Paper of [REDACTED]. [HOSPITAL] 1447; [DATE].

Aortic atherosclerosis.

## 2020-06-13 IMAGING — CR DG CHEST 2V
2 series · 2 of 2 positions shown · non-contrast
Comparison: September 02, 2016

CLINICAL DATA: Previous treatment for tuberculosis. Hypertension.
History of heart transplant

EXAM:
CHEST - 2 VIEW

[chest pa]
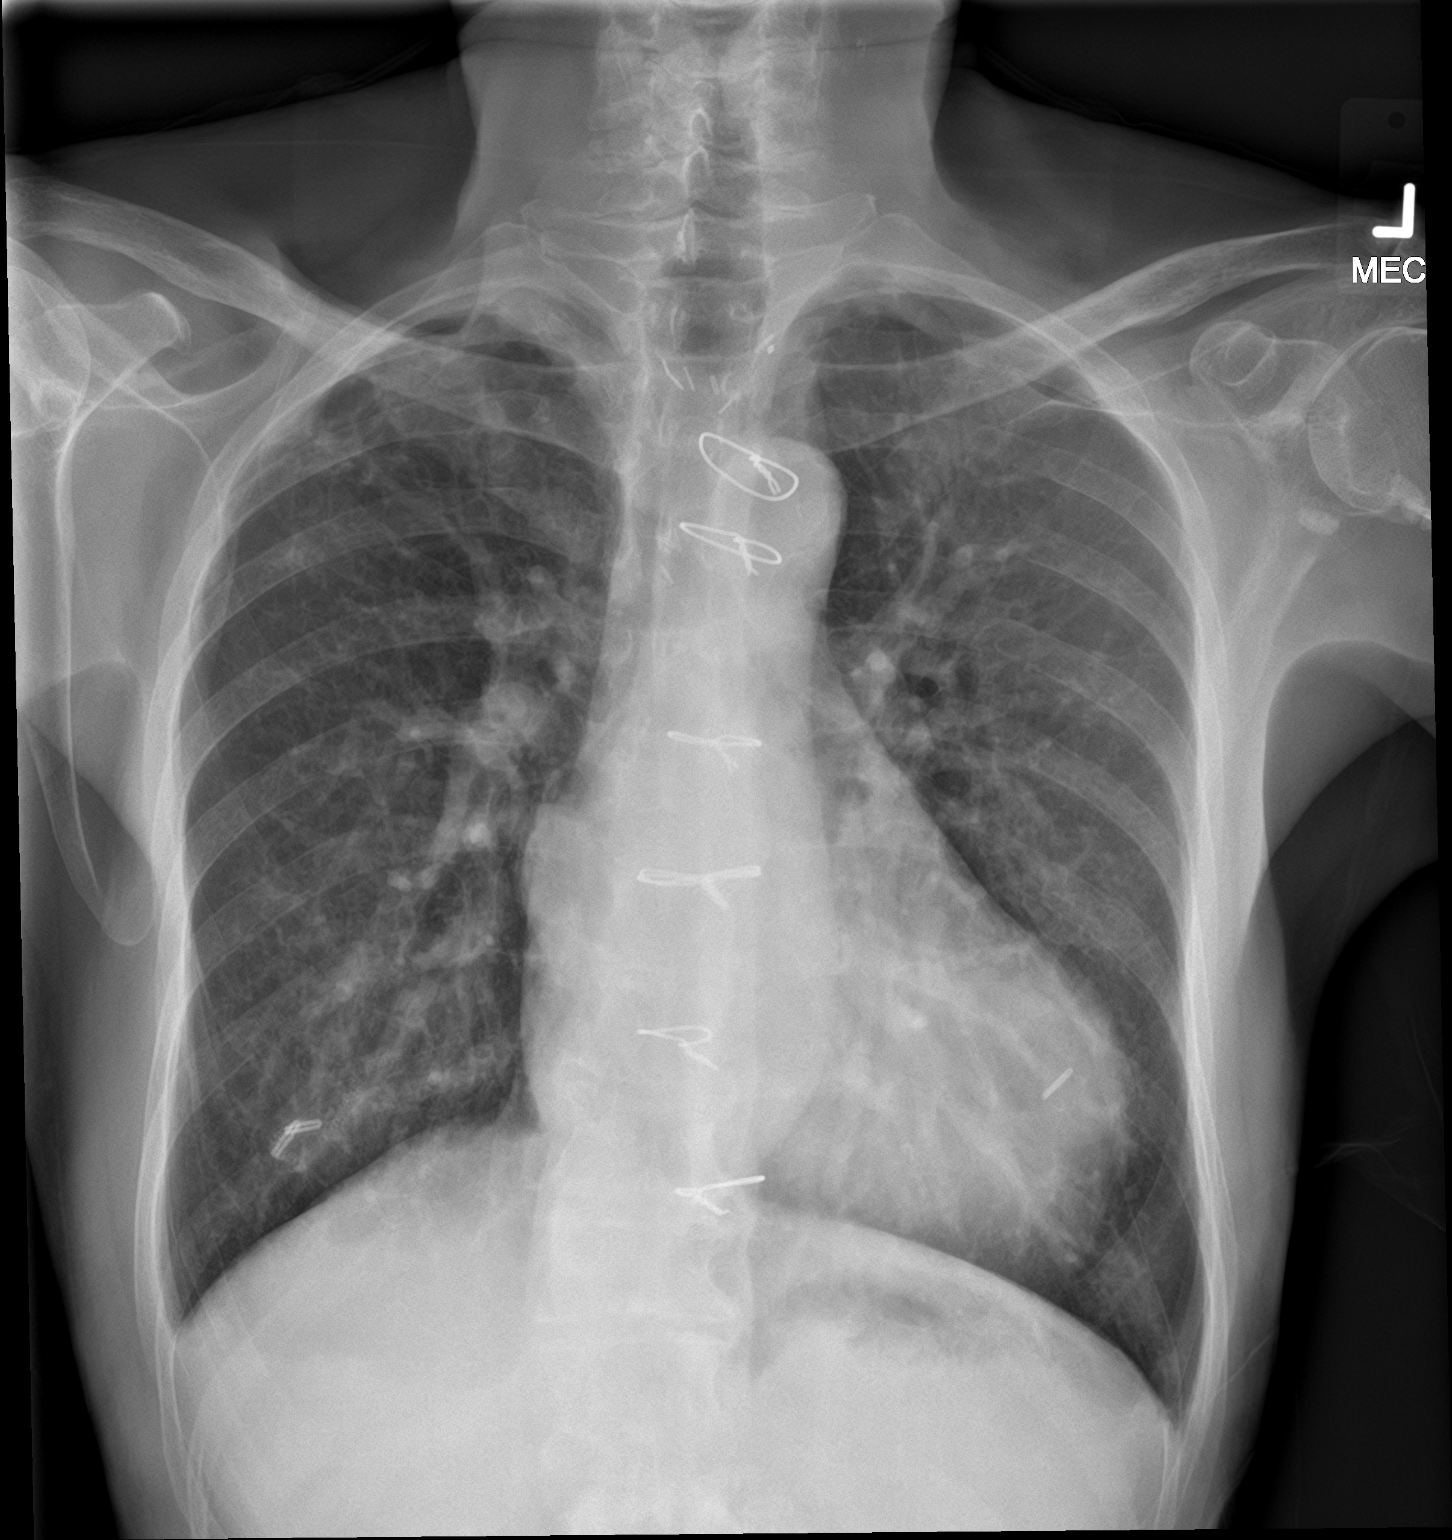

[chest lat]
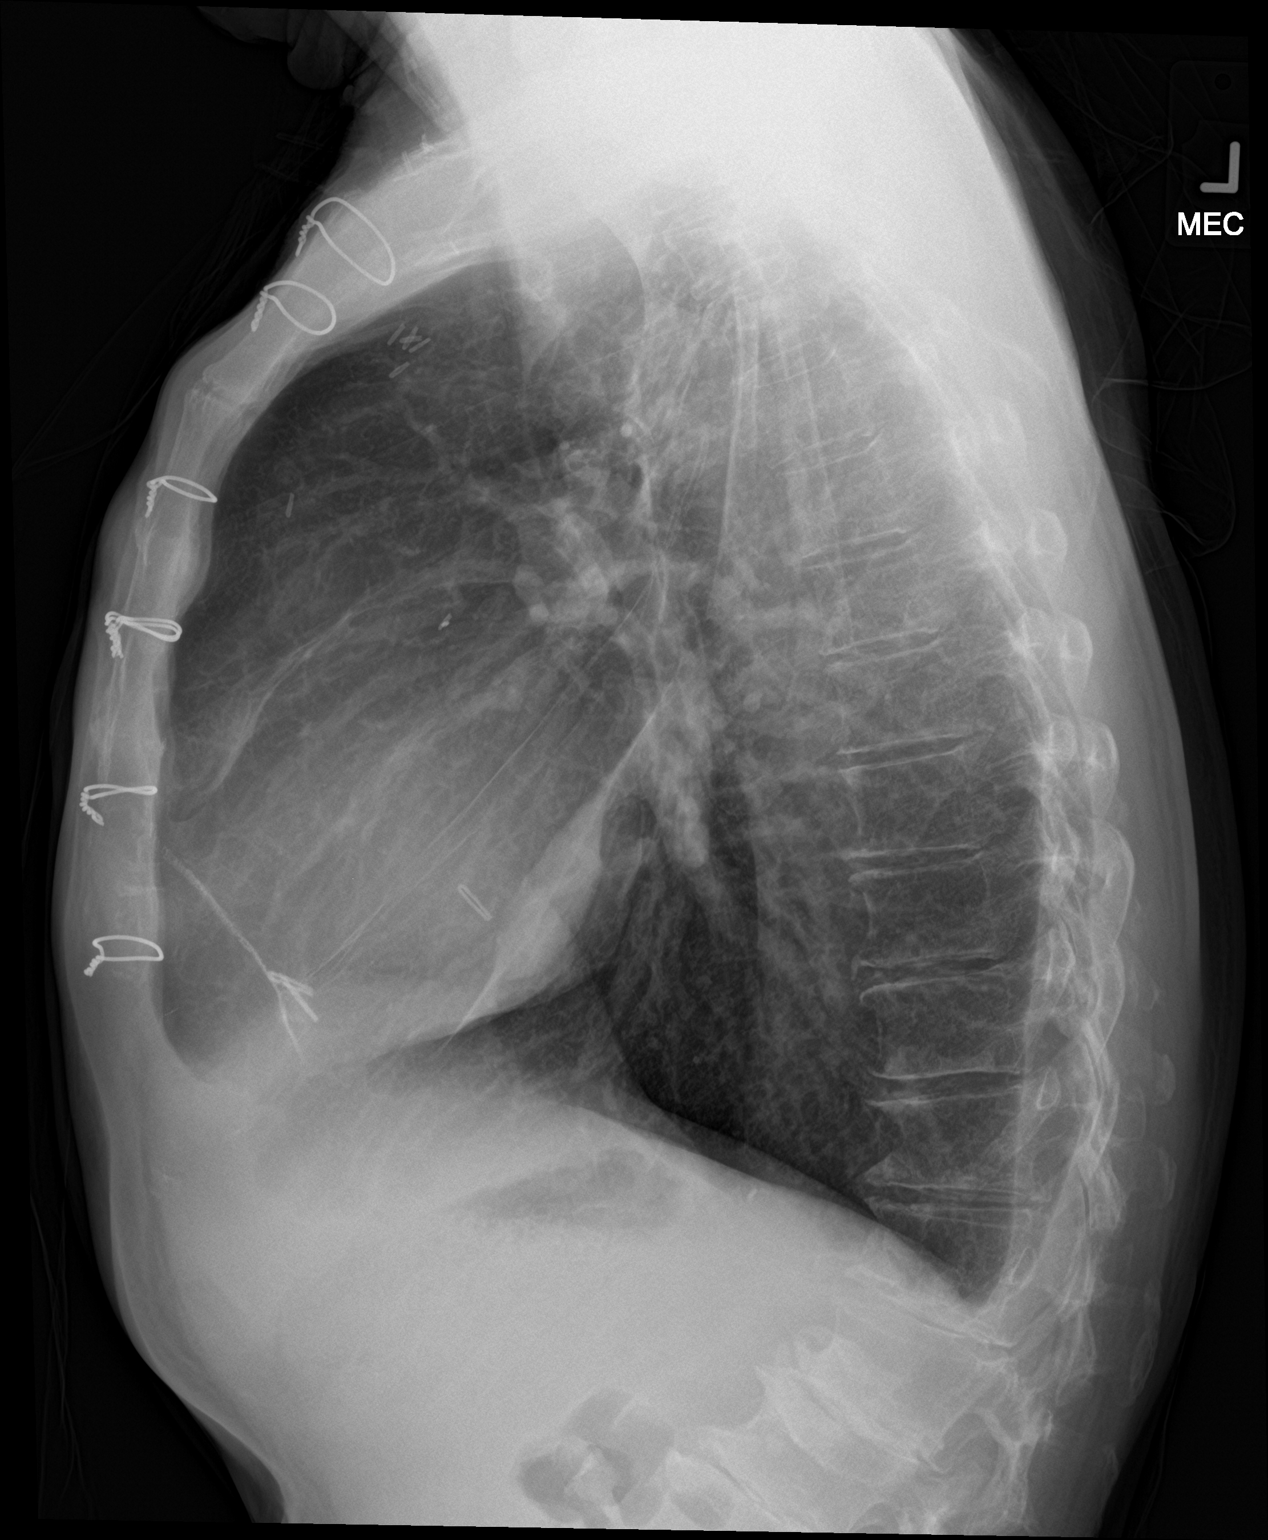

[2 of 2 positions shown; findings below may reference images not displayed]

FINDINGS: Lungs remain mildly hyperexpanded. There is scarring in the apices.
Postoperative changes noted in the right lung base region. There is
no edema or consolidation. There is no appreciable adenopathy. Heart
is upper normal in size with pulmonary vascularity normal. Patient
is status post median sternotomy. Calcification is noted in the left
carotid artery. There is aortic atherosclerosis. There is
degenerative change in the lower thoracic and upper lumbar regions
with kyphosis in this area.
IMPRESSION: Apical scarring bilaterally, potentially due to prior tuberculosis.
No edema or consolidation. No adenopathy. No findings suggesting
active tuberculosis by radiography.

Stable cardiac silhouette. Areas of postoperative change noted.
There is aortic atherosclerosis as well as left carotid artery
calcification.

Aortic Atherosclerosis ([id number]).
